# Patient Record
Sex: Female | Born: 1975 | Race: White | Hispanic: No | Smoking: Never smoker
Health system: Southern US, Community
[De-identification: ages and names within clinical notes are randomized; demographics above are authoritative.]

## PROBLEM LIST (undated history)

## (undated) DIAGNOSIS — N800 Endometriosis of uterus: Secondary | ICD-10-CM

## (undated) DIAGNOSIS — K219 Gastro-esophageal reflux disease without esophagitis: Secondary | ICD-10-CM

## (undated) HISTORY — DX: Endometriosis of uterus: N80.0

## (undated) HISTORY — DX: Gastro-esophageal reflux disease without esophagitis: K21.9

---

## 2004-03-10 ENCOUNTER — Emergency Department: Payer: Self-pay | Admitting: Emergency Medicine

## 2008-04-04 DIAGNOSIS — N8000 Endometriosis of the uterus, unspecified: Secondary | ICD-10-CM

## 2008-04-04 HISTORY — PX: DILATION AND CURETTAGE OF UTERUS: SHX78

## 2008-04-04 HISTORY — DX: Endometriosis of the uterus, unspecified: N80.00

## 2008-04-04 HISTORY — DX: Endometriosis of uterus: N80.0

## 2009-01-02 HISTORY — PX: OTHER SURGICAL HISTORY: SHX169

## 2011-01-11 ENCOUNTER — Ambulatory Visit: Payer: Self-pay | Admitting: Internal Medicine

## 2013-04-11 ENCOUNTER — Ambulatory Visit: Payer: Self-pay | Admitting: Obstetrics & Gynecology

## 2013-04-18 ENCOUNTER — Ambulatory Visit: Payer: BC Managed Care – PPO

## 2013-04-18 ENCOUNTER — Ambulatory Visit (INDEPENDENT_AMBULATORY_CARE_PROVIDER_SITE_OTHER): Payer: BC Managed Care – PPO | Admitting: General Surgery

## 2013-04-18 ENCOUNTER — Encounter: Payer: Self-pay | Admitting: General Surgery

## 2013-04-18 VITALS — BP 122/78 | HR 76 | Resp 12 | Ht 64.0 in | Wt 209.0 lb

## 2013-04-18 DIAGNOSIS — N63 Unspecified lump in unspecified breast: Secondary | ICD-10-CM

## 2013-04-18 HISTORY — PX: BREAST SURGERY: SHX581

## 2013-04-18 NOTE — Progress Notes (Signed)
Patient ID: Robyn Espinoza, female   DOB: [birth year], 38 y.o.   MRN: [id number]  Chief Complaint  Patient presents with  . Other    mammogram    HPI Robyn Espinoza is a 38 y.o. female who presents for a breast evaluation. The most recent mammogram was done on 03/25/13 . Also added views and ultrasound on left breast on 04/11/13.  Patient does perform regular self breast checks .She  states on 03/25/13 was her first mammogram. The patient is not aware of any trauma to the breast. She has one child, a 22 year old son. She did not breast-feed. No paternal family history of breast cancer. The patient reports little knowledge of her father's side of the family. She is accompanied today by her younger sister.  HPI  Past Medical History  Diagnosis Date  . Endometriosis, cervix   . GERD (gastroesophageal reflux disease)     Past Surgical History  Procedure Laterality Date  . Dilation and curettage of uterus  2010  . Laproscopy  01/2009  . Breast surgery Left April 18, 2048    vacuum biopsy left upper outer quadrant lesion    No family history on file.  Social History History  Substance Use Topics  . Smoking status: Never Smoker   . Smokeless tobacco: Never Used  . Alcohol Use: No    No Known Allergies  Current Outpatient Prescriptions  Medication Sig Dispense Refill  . esomeprazole (NEXIUM) 40 MG capsule Take 40 mg by mouth daily at 12 noon.      . etonogestrel (NEXPLANON) 68 MG IMPL implant Inject 1 each into the skin once.       No current facility-administered medications for this visit.    Review of Systems Review of Systems  Constitutional: Negative.   Respiratory: Negative.   Cardiovascular: Negative.     Blood pressure 122/78, pulse 76, resp. rate 12, height 5\' 4"  (1.626 m), weight 209 lb (94.802 kg), last menstrual period 03/19/2011.  Physical Exam Physical Exam  Constitutional: She is oriented to person, place, and time. She appears well-developed and  well-nourished.  Eyes: Conjunctivae are normal. No scleral icterus.  Neck: Neck supple.  Cardiovascular: Normal rate, regular rhythm and normal heart sounds.   Pulmonary/Chest: Effort normal and breath sounds normal. Right breast exhibits no inverted nipple, no mass, no nipple discharge, no skin change and no tenderness.  Lymphadenopathy:    She has no cervical adenopathy.    She has no axillary adenopathy.  Neurological: She is alert and oriented to person, place, and time.  Skin: Skin is warm and dry.    Data Reviewed Screening mammograms dated March 25, 2013 showed dense breast tissue with a suggestion of a nodular density in the upper-outer quadrant measuring 8-9 mm. Additional views were requested.  Focal spot compression views and ultrasound dated April 11, 2012 showed a persistent lobular mass measuring 1.7 cm. Ultrasound suggested an irregular hypoechoic mass at 2:00, 2 cm from the nipple measuring 0.9 x 1.0 x 1.2 cm. BI-RAD-4.  PCP notes reviewed.  Ultrasound examination of the left breast in the upper outer quadrant at the one to 2:00 position 2 cm from nipple indeed showed a multilobulated hypoechoic mass with a decreased tic shadowing measuring up to 1.5 cm in diameter.  The patient was amenable to vacuum biopsy. A total of 10 cc of 0.5% Xylocaine with 0.25% Marcaine with one 200,000 units of epinephrine was utilized well-tolerated. Chlor prep was applied to the skin. Approximately 12 core  samples, 9-gauge making use of an Encor vacuum biopsy device was completed with near complete removal of the lesion. A postbiopsy clip was placed. Skin defect was closed with benzoin and Steri-Strips followed by Telfa and Tegaderm dressing. The procedure was well-tolerated.  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     Plan    Postbiopsy instructions were provided. Arrangements are in place for a nursing follow up in one week. The patient will be contacted with the  pathology results are returned.        Robyn MayotteByrnett, Jeffrey W 04/18/2013, 9:57 PM

## 2013-04-18 NOTE — Patient Instructions (Signed)

## 2013-04-19 ENCOUNTER — Telehealth: Payer: Self-pay | Admitting: General Surgery

## 2013-04-19 LAB — PATHOLOGY

## 2013-04-19 NOTE — Telephone Encounter (Signed)
Patient notified that pathology showed a fibroadenoma, as expected. Reports she is doing well. She will return next week for staff wound check. She will be asked to return for MD check in six months.

## 2013-04-25 ENCOUNTER — Ambulatory Visit (INDEPENDENT_AMBULATORY_CARE_PROVIDER_SITE_OTHER): Payer: BC Managed Care – PPO | Admitting: *Deleted

## 2013-04-25 DIAGNOSIS — N63 Unspecified lump in unspecified breast: Secondary | ICD-10-CM

## 2013-04-25 NOTE — Patient Instructions (Signed)
The patient is aware that a heating pad may be used for comfort as needed.   Continue self breast exams. Call office for any new breast issues or concerns. 

## 2013-04-25 NOTE — Progress Notes (Signed)
Patient here today for follow up post left breast biopsy.  Steristrip in place and aware it may come off in one week.  Minimal bruising noted.  The patient is aware that a heating pad may be used for comfort as needed.  Aware of pathology. Follow up as needed.

## 2013-07-22 ENCOUNTER — Ambulatory Visit: Payer: Self-pay | Admitting: Internal Medicine

## 2013-10-22 ENCOUNTER — Ambulatory Visit: Payer: Self-pay | Admitting: General Surgery

## 2013-11-26 ENCOUNTER — Encounter: Payer: Self-pay | Admitting: *Deleted

## 2014-02-03 ENCOUNTER — Encounter: Payer: Self-pay | Admitting: General Surgery

## 2015-12-08 ENCOUNTER — Encounter: Payer: Self-pay | Admitting: *Deleted

## 2015-12-14 ENCOUNTER — Ambulatory Visit (INDEPENDENT_AMBULATORY_CARE_PROVIDER_SITE_OTHER): Payer: BLUE CROSS/BLUE SHIELD | Admitting: General Surgery

## 2015-12-14 ENCOUNTER — Encounter: Payer: Self-pay | Admitting: General Surgery

## 2015-12-14 VITALS — BP 124/80 | HR 78 | Resp 12 | Ht 64.0 in | Wt 221.0 lb

## 2015-12-14 DIAGNOSIS — G8929 Other chronic pain: Secondary | ICD-10-CM | POA: Diagnosis not present

## 2015-12-14 DIAGNOSIS — R1010 Upper abdominal pain, unspecified: Secondary | ICD-10-CM

## 2015-12-14 DIAGNOSIS — K219 Gastro-esophageal reflux disease without esophagitis: Secondary | ICD-10-CM | POA: Diagnosis not present

## 2015-12-14 DIAGNOSIS — R1011 Right upper quadrant pain: Principal | ICD-10-CM

## 2015-12-14 NOTE — Patient Instructions (Addendum)
Patient to have a HIDA scan done   [facility]. She will pre admit by phone. The patient is aware of date and instructions.

## 2015-12-14 NOTE — Progress Notes (Signed)
Patient ID: Robyn Espinoza, female   DOB: 02-Oct-1975, 40 y.o.   MRN: [id number]  Chief Complaint  Patient presents with  . Abdominal Pain    HPI Robyn Espinoza is a 40 y.o. female here today for a evaluation of abdominal pain for about three weeks . Patient states the pain is located in right upper quadrant and radiates to her epigastric area.  The pain is described as sharp and the pain last for three hours . After she eats the pain starts. Moves her bowel every three days, This is her baseline.  No history of diarrhea.  Long history of reflux, recently not is well controlled with Nexium and Zantac.  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 She was initially seen at a clinic in [place] and at that time was noted to have significant right upper quadrant pain. She was transferred to the [facility] emergency department in [place] where she underwent laboratory studies showing an elevated white blood cell count of 12,000 with a left shift, normal urinalysis except for a specific gravity greater than 1.030, elevated blood sugar 161, normal electrolytes and liver function studies. Ultrasound failed to show evidence of gallstones, changes consistent with fatty liver. Common bile duct of 3 mm. Gallbladder was distended. No gallbladder wall thickening, 2 mm. Negative sonographic Murphy sign. No pericholecystic fluid.  The patient separately underwent a contrast-enhanced CT that showed no acute intra-abdominal abnormality. It did confirm the ultrasound impression of static steatosis.  The patient [place] by [place] employer and she returned home for further assessment.  The patient reports she has had chills during her episodes of pain but has not taken her temperature. Vomiting has not been a part of her present symptom complex.  Marland KitchenHPI  Past Medical History:  Diagnosis Date  . Endometriosis, cervix 2010   [place]  . GERD  (gastroesophageal reflux disease)     Past Surgical History:  Procedure Laterality Date  . BREAST SURGERY Left April 18, 2048   vacuum biopsy left upper outer quadrant lesion  . DILATION AND CURETTAGE OF UTERUS  2010  . laproscopy  01/2009    History reviewed. No pertinent family history.  Social History Social History  Substance Use Topics  . Smoking status: Never Smoker  . Smokeless tobacco: Never Used  . Alcohol use No    No Known Allergies  Current Outpatient Prescriptions  Medication Sig Dispense Refill  . dicyclomine (BENTYL) 20 MG tablet Take 20 mg by mouth every 6 (six) hours.    Marland Kitchen esomeprazole (NEXIUM) 40 MG capsule Take 40 mg by mouth daily at 12 noon.    . etonogestrel (NEXPLANON) 68 MG IMPL implant Inject 1 each into the skin once.    Marland Kitchen HYDROcodone-acetaminophen (NORCO/VICODIN) 5-325 MG tablet Take 1 tablet by mouth every 6 (six) hours as needed for moderate pain.    Marland Kitchen ibuprofen (ADVIL,MOTRIN) 800 MG tablet Take 800 mg by mouth every 8 (eight) hours as needed.    . ondansetron (ZOFRAN) 4 MG tablet Take 4 mg by mouth every 8 (eight) hours as needed for nausea or vomiting.    . ranitidine (ZANTAC) 150 MG tablet Take 150 mg by mouth 2 (two) times daily.    . valACYclovir (VALTREX) 500 MG tablet TAKE 1 TABLET ORALLY DAILY  11   No current facility-administered medications for this visit.     Review of Systems Review of Systems  Constitutional: Positive for chills (  aleast once a day).  Respiratory: Negative.   Gastrointestinal: Positive for abdominal pain and nausea.    Blood pressure 124/80, pulse 78, resp. rate 12, height 5\' 4"  (1.626 m), weight 221 lb (100.2 kg).  Physical Exam Physical Exam  Constitutional: She is oriented to person, place, and time. She appears well-developed and well-nourished.  Eyes: Conjunctivae are normal. No scleral icterus.  Neck: Neck supple.  Cardiovascular: Normal rate, regular rhythm and normal heart sounds.    Pulmonary/Chest: Effort normal and breath sounds normal.  Abdominal: Soft. Normal appearance and bowel sounds are normal. There is no hepatomegaly. There is tenderness in the right lower quadrant. No hernia.    Lymphadenopathy:    She has no cervical adenopathy.  Neurological: She is alert and oriented to person, place, and time.  Skin: Skin is warm and dry.    Data Reviewed Tory studies dated December 02, 2015 showed white blood cell, 12,600 with 82% polys and 16% lymphs, hemoglobin 14.2 with an MCV of 87. Platelet count of 352,000. Urinalysis notable for specific gravity 1.030. Negative nitrite, negative leukocyte Estrace. Competence metabolic panel showed a creatinine of 0.8 with a BUN of 16. Normal electrolytes area random blood sugar 161 (4 PM in the afternoon) sees. Estimated GFR: 96. Normal liver function studies. Normal lipase and alkaline phosphatase. Total bilirubin 0.8, direct bili is 0.6 (0.1-0.4) sees indirect bilirubin 0.2.  Assessment    Clinical history compatible with biliary colic, no stones identified on ultrasound. No other pathology identified on CT.  Long history of gastroesophageal reflux, present symptoms markedly different from her baseline.    Plan    We'll arrange for a HIDA with CCK to assess gallbladder function. If her gallbladder is either nonfunctional, or her symptoms are replicated with the administration of a fatty meal/CCK, we'll proceed to cholecystectomy. If the scan is normal, we'll need to reassess other possibilities for her pain.  She is anxious to return to work as she is the sole breadwinner for the family, and she needs to be 100% before she returns to [place].  Laparoscopic Cholecystectomy with Intraoperative Cholangiogram. The procedure, including it's potential risks and complications (including but not limited to infection, bleeding, injury to intra-abdominal organs or bile ducts, bile leak, poor cosmetic result, sepsis and death) were  discussed with the patient in detail. Non-operative options, including their inherent risks (acute calculous cholecystitis with possible choledocholithiasis or gallstone pancreatitis, with the risk of ascending cholangitis, sepsis, and death) were discussed as well. The patient expressed and understanding of what we discussed and wishes to proceed with laparoscopic cholecystectomy. The patient further understands that if it is technically not possible, or it is unsafe to proceed laparoscopically, that I will convert to an open cholecystectomy.      [facility]. She will pre admit by phone. The patient is aware of date and instructions.  Patient to have a HIDA scan done  This information has been scribed by Ples SpecterJessica Qualls CMA.   Earline MayotteByrnett, Margreat Widener W 12/15/2015, 10:21 AM    Repeat CBC completed on 12/14/2015 shows a persistent leukocytosis of 12,600 with less of a left shift, 67% polys, 26% lymphocytes. Hemoglobin 13.5.

## 2015-12-15 ENCOUNTER — Telehealth: Payer: Self-pay

## 2015-12-15 ENCOUNTER — Other Ambulatory Visit: Payer: Self-pay

## 2015-12-15 ENCOUNTER — Encounter: Payer: Self-pay | Admitting: General Surgery

## 2015-12-15 ENCOUNTER — Other Ambulatory Visit
Admission: RE | Admit: 2015-12-15 | Discharge: 2015-12-15 | Disposition: A | Discharge status: Home / Self Care | Payer: BLUE CROSS/BLUE SHIELD | Source: Ambulatory Visit | Attending: General Surgery | Admitting: General Surgery

## 2015-12-15 DIAGNOSIS — R1011 Right upper quadrant pain: Principal | ICD-10-CM

## 2015-12-15 DIAGNOSIS — Z01812 Encounter for preprocedural laboratory examination: Secondary | ICD-10-CM | POA: Diagnosis present

## 2015-12-15 DIAGNOSIS — Z01818 Encounter for other preprocedural examination: Secondary | ICD-10-CM

## 2015-12-15 DIAGNOSIS — K219 Gastro-esophageal reflux disease without esophagitis: Secondary | ICD-10-CM | POA: Insufficient documentation

## 2015-12-15 DIAGNOSIS — G8929 Other chronic pain: Secondary | ICD-10-CM | POA: Insufficient documentation

## 2015-12-15 LAB — CBC WITH DIFFERENTIAL/PLATELET
Basophils Absolute: 0 10*3/uL (ref 0.0–0.2)
Basos: 0 %
EOS (ABSOLUTE): 0.2 10*3/uL (ref 0.0–0.4)
EOS: 1 %
HEMATOCRIT: 39.3 % (ref 34.0–46.6)
HEMOGLOBIN: 13.5 g/dL (ref 11.1–15.9)
IMMATURE GRANS (ABS): 0 10*3/uL (ref 0.0–0.1)
IMMATURE GRANULOCYTES: 0 %
Lymphocytes Absolute: 3.3 10*3/uL — ABNORMAL HIGH (ref 0.7–3.1)
Lymphs: 26 %
MCH: 30.2 pg (ref 26.6–33.0)
MCHC: 34.4 g/dL (ref 31.5–35.7)
MCV: 88 fL (ref 79–97)
MONOCYTES: 6 %
Monocytes Absolute: 0.8 10*3/uL (ref 0.1–0.9)
NEUTROS PCT: 67 %
Neutrophils Absolute: 8.3 10*3/uL — ABNORMAL HIGH (ref 1.4–7.0)
Platelets: 358 10*3/uL (ref 150–379)
RBC: 4.47 x10E6/uL (ref 3.77–5.28)
RDW: 13.1 % (ref 12.3–15.4)
WBC: 12.6 10*3/uL — AB (ref 3.4–10.8)

## 2015-12-15 LAB — PREGNANCY, URINE: Preg Test, Ur: NEGATIVE

## 2015-12-15 NOTE — Telephone Encounter (Signed)
Spoke with the patient and she is scheduled for [facility] at 8:00 am. She will be off of her Vicodin for 24 hours prior. She will arrive at 7:30 am [facility] and have nothing to eat or drink after midnight the night before. The patient is aware of date, time, and instructions.

## 2015-12-16 ENCOUNTER — Encounter
Admission: RE | Admit: 2015-12-16 | Discharge: 2015-12-16 | Disposition: A | Discharge status: Home / Self Care | Payer: BLUE CROSS/BLUE SHIELD | Source: Ambulatory Visit | Attending: General Surgery | Admitting: General Surgery

## 2015-12-16 ENCOUNTER — Encounter: Payer: Self-pay | Admitting: Radiology

## 2015-12-16 DIAGNOSIS — G8929 Other chronic pain: Secondary | ICD-10-CM | POA: Diagnosis present

## 2015-12-16 DIAGNOSIS — R1010 Upper abdominal pain, unspecified: Secondary | ICD-10-CM | POA: Diagnosis present

## 2015-12-16 DIAGNOSIS — R1011 Right upper quadrant pain: Secondary | ICD-10-CM

## 2015-12-16 MED ORDER — TECHNETIUM TC 99M MEBROFENIN IV KIT
5.0000 | PACK | Freq: Once | INTRAVENOUS | Status: AC | PRN
  Administered 2015-12-16: 5.07 via INTRAVENOUS

## 2015-12-17 ENCOUNTER — Other Ambulatory Visit: Payer: Self-pay | Admitting: General Surgery

## 2015-12-17 ENCOUNTER — Telehealth: Payer: Self-pay

## 2015-12-17 ENCOUNTER — Other Ambulatory Visit: Payer: Self-pay

## 2015-12-17 DIAGNOSIS — R1013 Epigastric pain: Secondary | ICD-10-CM

## 2015-12-17 NOTE — H&P (Signed)
Robyn BlueSuzanne D Espinoza [id number] 04-Aug-1975     HPI: Patient seen with epigastric and right upper quadrant pain. CT and ultrasound negative in [place]. Recently completed HIDA with CCK showed normal gallbladder ejection fraction of 60% without reproduction of symptoms.  Long history of reflux, now poorly controlled with Nexium. For upper endoscopy.   (Not in a hospital admission) No Known Allergies Past Medical History:  Diagnosis Date  . Endometriosis, cervix 2010   [place]  . GERD (gastroesophageal reflux disease)    Past Surgical History:  Procedure Laterality Date  . BREAST SURGERY Left 04/18/2013   vacuum biopsy left upper outer quadrant lesion, fibroadenoma  . DILATION AND CURETTAGE OF UTERUS  2010  . laproscopy  01/2009   Social History   Social History  . Marital status: Divorced    Spouse name: N/A  . Number of children: N/A  . Years of education: N/A   Occupational History  . Not on file.   Social History Main Topics  . Smoking status: Never Smoker  . Smokeless tobacco: Never Used  . Alcohol use No  . Drug use: No  . Sexual activity: Not on file   Other Topics Concern  . Not on file   Social History Narrative  . No narrative on file   Social History   Social History Narrative  . No narrative on file     ROS: Negative.     PE: HEENT: Negative. Lungs: Clear. Cardio: RR. Robyn Espinoza, Robyn Espinoza 12/17/2015   Assessment/Plan:  Proceed with planned endoscopy.

## 2015-12-17 NOTE — Telephone Encounter (Signed)
The patent's surgery for 12/18/15 has been canceled. She is to have an upper endoscopy done on 12/18/15. Instructions, and date all discussed with the patient. She is fully aware.

## 2015-12-18 ENCOUNTER — Ambulatory Visit
Admission: RE | Admit: 2015-12-18 | Payer: BLUE CROSS/BLUE SHIELD | Source: Ambulatory Visit | Admitting: General Surgery

## 2015-12-18 ENCOUNTER — Ambulatory Visit
Admission: RE | Admit: 2015-12-18 | Discharge: 2015-12-18 | Disposition: A | Discharge status: Home / Self Care | Payer: BLUE CROSS/BLUE SHIELD | Source: Ambulatory Visit | Attending: General Surgery | Admitting: General Surgery

## 2015-12-18 ENCOUNTER — Ambulatory Visit: Payer: BLUE CROSS/BLUE SHIELD | Admitting: Anesthesiology

## 2015-12-18 ENCOUNTER — Encounter: Payer: Self-pay | Admitting: Anesthesiology

## 2015-12-18 ENCOUNTER — Encounter
Admission: RE | Disposition: A | Discharge status: Home / Self Care | Payer: Self-pay | Source: Ambulatory Visit | Attending: General Surgery

## 2015-12-18 ENCOUNTER — Encounter: Admission: RE | Payer: Self-pay | Source: Ambulatory Visit

## 2015-12-18 DIAGNOSIS — K449 Diaphragmatic hernia without obstruction or gangrene: Secondary | ICD-10-CM | POA: Diagnosis not present

## 2015-12-18 DIAGNOSIS — Z79899 Other long term (current) drug therapy: Secondary | ICD-10-CM | POA: Diagnosis not present

## 2015-12-18 DIAGNOSIS — K219 Gastro-esophageal reflux disease without esophagitis: Secondary | ICD-10-CM | POA: Diagnosis not present

## 2015-12-18 DIAGNOSIS — K2950 Unspecified chronic gastritis without bleeding: Secondary | ICD-10-CM | POA: Diagnosis not present

## 2015-12-18 DIAGNOSIS — R1011 Right upper quadrant pain: Secondary | ICD-10-CM

## 2015-12-18 DIAGNOSIS — R1013 Epigastric pain: Secondary | ICD-10-CM

## 2015-12-18 DIAGNOSIS — G8929 Other chronic pain: Secondary | ICD-10-CM

## 2015-12-18 HISTORY — PX: ESOPHAGOGASTRODUODENOSCOPY (EGD) WITH PROPOFOL: SHX5813

## 2015-12-18 LAB — HEPATIC FUNCTION PANEL
ALK PHOS: 72 U/L (ref 38–126)
ALT: 38 U/L (ref 14–54)
AST: 28 U/L (ref 15–41)
Albumin: 3.7 g/dL (ref 3.5–5.0)
BILIRUBIN DIRECT: 0.1 mg/dL (ref 0.1–0.5)
BILIRUBIN INDIRECT: 0.4 mg/dL (ref 0.3–0.9)
Total Bilirubin: 0.5 mg/dL (ref 0.3–1.2)
Total Protein: 6.8 g/dL (ref 6.5–8.1)

## 2015-12-18 LAB — POCT PREGNANCY, URINE: PREG TEST UR: NEGATIVE

## 2015-12-18 SURGERY — LAPAROSCOPIC CHOLECYSTECTOMY WITH INTRAOPERATIVE CHOLANGIOGRAM
Anesthesia: Choice

## 2015-12-18 SURGERY — ESOPHAGOGASTRODUODENOSCOPY (EGD) WITH PROPOFOL
Anesthesia: General

## 2015-12-18 MED ORDER — PANTOPRAZOLE SODIUM 40 MG PO TBEC: 40.0000 mg | DELAYED_RELEASE_TABLET | Freq: Every day | ORAL | 11 refills | Status: DC

## 2015-12-18 MED ORDER — PROPOFOL 500 MG/50ML IV EMUL
INTRAVENOUS | Status: DC | PRN
  Administered 2015-12-18: 190 ug/kg/min via INTRAVENOUS

## 2015-12-18 MED ORDER — PROPOFOL 10 MG/ML IV BOLUS
INTRAVENOUS | Status: DC | PRN
  Administered 2015-12-18 (×2): 100 mg via INTRAVENOUS

## 2015-12-18 MED ORDER — SODIUM CHLORIDE 0.9 % IV SOLN
INTRAVENOUS | Status: DC
  Administered 2015-12-18: 1000 mL via INTRAVENOUS

## 2015-12-18 NOTE — Transfer of Care (Signed)
Immediate Anesthesia Transfer of Care Note  Patient: [name]  Procedure(s) Performed: Procedure(s): ESOPHAGOGASTRODUODENOSCOPY (EGD) WITH PROPOFOL (N/A)  Patient Location: PACU  Anesthesia Type:General  Level of Consciousness: sedated  Airway & Oxygen Therapy: Patient Spontanous Breathing and Patient connected to face mask oxygen  Post-op Assessment: Report given to RN and Post -op Vital signs reviewed and stable  Post vital signs: Reviewed and stable  Last Vitals:  Vitals:   12/18/15 1220 12/18/15 1318  BP: 132/67 133/73  Pulse: 60 72  Resp: 16 13  Temp: 36.7 C (!) 36.1 C    Complications: No apparent anesthesia complications

## 2015-12-18 NOTE — H&P (Signed)
No change in clinical history or exam.  HIDA w/ stimulation did not reproduce symptoms.  For EGD and biopsy.

## 2015-12-18 NOTE — Anesthesia Preprocedure Evaluation (Signed)
Anesthesia Evaluation  Patient identified by MRN, date of birth, ID band Patient awake    Reviewed: Allergy & Precautions, H&P , NPO status , Patient's Chart, lab work & pertinent test results, reviewed documented beta blocker date and time   History of Anesthesia Complications Negative for: history of anesthetic complications  Airway Mallampati: III  TM Distance: >3 FB Neck ROM: full    Dental no notable dental hx. (+) Missing, Teeth Intact   Pulmonary neg pulmonary ROS,    Pulmonary exam normal breath sounds clear to auscultation       Cardiovascular Exercise Tolerance: Good negative cardio ROS Normal cardiovascular exam Rhythm:regular Rate:Normal     Neuro/Psych negative neurological ROS  negative psych ROS   GI/Hepatic Neg liver ROS, GERD  ,  Endo/Other  negative endocrine ROS  Renal/GU negative Renal ROS  negative genitourinary   Musculoskeletal   Abdominal   Peds  Hematology negative hematology ROS (+)   Anesthesia Other Findings Past Medical History: 2010: Endometriosis, cervix     Comment: [place] No date: GERD (gastroesophageal reflux disease)   Reproductive/Obstetrics negative OB ROS                             Anesthesia Physical Anesthesia Plan  ASA: II  Anesthesia Plan: General   Post-op Pain Management:    Induction:   Airway Management Planned:   Additional Equipment:   Intra-op Plan:   Post-operative Plan:   Informed Consent: I have reviewed the patients History and Physical, chart, labs and discussed the procedure including the risks, benefits and alternatives for the proposed anesthesia with the patient or authorized representative who has indicated his/her understanding and acceptance.   Dental Advisory Given  Plan Discussed with: Anesthesiologist, CRNA and Surgeon  Anesthesia Plan Comments:         Anesthesia Quick Evaluation

## 2015-12-18 NOTE — Anesthesia Procedure Notes (Signed)
Date/Time: 12/18/2015 1:00 PM Performed by: Stormy FabianURTIS, Augusta Hilbert Pre-anesthesia Checklist: Patient identified, Emergency Drugs available, Suction available and Patient being monitored Patient Re-evaluated:Patient Re-evaluated prior to inductionOxygen Delivery Method: Nasal cannula Intubation Type: IV induction Dental Injury: Teeth and Oropharynx as per pre-operative assessment  Comments: Nasal cannula with etCO2 monitoring

## 2015-12-18 NOTE — Op Note (Signed)
[facility] Gastroenterology Patient Name: Robyn Espinoza Procedure Date: 12/18/2015 12:01 PM MRN: [id number] Account #: [id number] Date of Birth: [birth year] Admit Type: Outpatient Age: 40 Room: [facility] ENDO ROOM 2 Gender: Female Note Status: Finalized Procedure:            Upper GI endoscopy Indications:          Epigastric abdominal pain, Suspected gastro-esophageal                        reflux disease Providers:            Earline Mayotte, MD Referring MD:         Margaretann Loveless, MD (Referring MD) Medicines:            Monitored Anesthesia Care Complications:        No immediate complications. Procedure:            Pre-Anesthesia Assessment:                       - Prior to the procedure, a History and Physical was                        performed, and patient medications, allergies and                        sensitivities were reviewed. The patient's tolerance of                        previous anesthesia was reviewed.                       - The risks and benefits of the procedure and the                        sedation options and risks were discussed with the                        patient. All questions were answered and informed                        consent was obtained.                       After obtaining informed consent, the endoscope was                        passed under direct vision. Throughout the procedure,                        the patient's blood pressure, pulse, and oxygen                        saturations were monitored continuously. The Endoscope                        was introduced through the mouth, and advanced to the                        second part of duodenum. The upper GI endoscopy was  accomplished without difficulty. The patient tolerated                        the procedure well. Findings:      A small hiatal hernia was present. GEJ at 37 cm.. Biopsis at 35 cm.      The stomach was normal. Cold  biopsis of prepyloric area obtained.      The examined duodenum was normal. Impression:           - Small hiatal hernia.                       - Normal stomach.                       - Normal examined duodenum.                       - No specimens collected. Recommendation:       - Discharge patient to home.                       - Telephone endoscopist for pathology results in 4 days. Procedure Code(s):    --- Professional ---                       [phone], Esophagogastroduodenoscopy, flexible, transoral;                        diagnostic, including collection of specimen(s) by                        brushing or washing, when performed (separate procedure) Diagnosis Code(s):    --- Professional ---                       R10.13, Epigastric pain                       K44.9, Diaphragmatic hernia without obstruction or                        gangrene CPT copyright 2016 American Medical Association. All rights reserved. The codes documented in this report are preliminary and upon coder review may  be revised to meet current compliance requirements. Earline MayotteJeffrey W. Sem Mccaughey, MD 12/18/2015 1:15:34 PM This report has been signed electronically. Number of Addenda: 0 Note Initiated On: 12/18/2015 12:01 PM      [facility]

## 2015-12-21 ENCOUNTER — Encounter: Payer: Self-pay | Admitting: General Surgery

## 2015-12-21 ENCOUNTER — Telehealth: Payer: Self-pay | Admitting: General Surgery

## 2015-12-21 LAB — SURGICAL PATHOLOGY

## 2015-12-21 NOTE — Anesthesia Postprocedure Evaluation (Signed)
Anesthesia Post Note  Patient: Robyn Espinoza  Procedure(s) Performed: Procedure(s) (LRB): ESOPHAGOGASTRODUODENOSCOPY (EGD) WITH PROPOFOL (N/A)  Patient location during evaluation: Endoscopy Anesthesia Type: General Level of consciousness: awake and alert Pain management: pain level controlled Vital Signs Assessment: post-procedure vital signs reviewed and stable Respiratory status: spontaneous breathing, nonlabored ventilation, respiratory function stable and patient connected to nasal cannula oxygen Cardiovascular status: blood pressure returned to baseline and stable Postop Assessment: no signs of nausea or vomiting Anesthetic complications: no    Last Vitals:  Vitals:   12/18/15 1328 12/18/15 1358  BP: (!) 123/104 136/82  Pulse: 64 (!) 59  Resp: 19 16  Temp:      Last Pain:  Vitals:   12/18/15 1318  TempSrc: Tympanic                 Lenard SimmerAndrew Carel Schnee

## 2015-12-21 NOTE — Telephone Encounter (Signed)
12-21-15 @ 10:30AM PT CALLED IN STATING SHE HAD AN UPPER ENDOSCOPY DONE ON Friday(12-18-15)&DR BYRNETT TOLD HER TO CALL IN THIS AM,BUT NOT SURE WHY. HE DID SAY SOMETHING ABOUT A STOOL SAMPLE,BUT THE ENDO NURSE SAID THEY DON'T DO IT THERE. THERE WHERE NO TELEPHONE MESSAGES IN SYSTEM & [name] CHECKED ALL THE BOXES AND NO NOTES WHERE IN.

## 2015-12-22 ENCOUNTER — Telehealth: Payer: Self-pay | Admitting: *Deleted

## 2015-12-22 NOTE — Telephone Encounter (Signed)
I talked with the patient and she will [facility] and [facility] specimen collection container, she was never given that instruction the day of her endoscopy. She states her symptoms seem to be the same.

## 2015-12-22 NOTE — Telephone Encounter (Signed)
-----   Message from Earline MayotteJeffrey W Byrnett, MD sent at 12/21/2015  9:45 PM EDT -----  Please notify path results fine.   See if patient has submitted stool sample for h pylori testing.   Any change in abdominal symptoms w/ change from Nexium to Protonix?  ----- Message ----- From: Interface, [facility] [place] Sent: 12/18/2015   2:17 PM To: Earline MayotteJeffrey W Byrnett, MD

## 2015-12-30 ENCOUNTER — Other Ambulatory Visit
Admission: RE | Admit: 2015-12-30 | Discharge: 2015-12-30 | Disposition: A | Discharge status: Home / Self Care | Payer: BLUE CROSS/BLUE SHIELD | Source: Ambulatory Visit | Attending: General Surgery | Admitting: General Surgery

## 2015-12-30 DIAGNOSIS — R1010 Upper abdominal pain, unspecified: Secondary | ICD-10-CM | POA: Diagnosis not present

## 2015-12-30 DIAGNOSIS — G8929 Other chronic pain: Secondary | ICD-10-CM | POA: Diagnosis present

## 2016-01-01 LAB — H. PYLORI ANTIGEN, STOOL: H. PYLORI STOOL AG, EIA: NEGATIVE

## 2016-01-04 ENCOUNTER — Telehealth: Payer: Self-pay | Admitting: *Deleted

## 2016-01-04 NOTE — Telephone Encounter (Signed)
-----   Message from Earline MayotteJeffrey W Byrnett, MD sent at 01/01/2016  9:47 AM EDT ----- Please notify the patient of the stool sample was negative. It a progress report on her abdominal pain. Thank you ----- Message ----- From: Leory PlowmanInterface, [facility] [place] Sent: 01/01/2016   9:40 AM To: Earline MayotteJeffrey W Byrnett, MD

## 2016-01-04 NOTE — Telephone Encounter (Signed)
Patient states the she is having acid reflux and that the Protonix is not helping, Dr. Lemar LivingsByrnett states she can start using the Nexium again . Patient states she is not having no more abdominal pain . May return to work on 01/10/16.

## 2016-01-04 NOTE — Telephone Encounter (Signed)
Notified patient as instructed, patient pleased. Discussed follow-up appointments, patient agrees  

## 2016-05-19 ENCOUNTER — Other Ambulatory Visit: Payer: Self-pay | Admitting: Nurse Practitioner

## 2016-05-19 DIAGNOSIS — Z1231 Encounter for screening mammogram for malignant neoplasm of breast: Secondary | ICD-10-CM

## 2016-07-28 ENCOUNTER — Ambulatory Visit: Payer: BLUE CROSS/BLUE SHIELD | Attending: Nurse Practitioner

## 2016-07-29 ENCOUNTER — Ambulatory Visit (INDEPENDENT_AMBULATORY_CARE_PROVIDER_SITE_OTHER): Payer: Managed Care, Other (non HMO)

## 2016-07-29 ENCOUNTER — Ambulatory Visit (INDEPENDENT_AMBULATORY_CARE_PROVIDER_SITE_OTHER): Payer: Managed Care, Other (non HMO) | Admitting: Podiatry

## 2016-07-29 ENCOUNTER — Encounter: Payer: Self-pay | Admitting: Podiatry

## 2016-07-29 DIAGNOSIS — M79672 Pain in left foot: Secondary | ICD-10-CM

## 2016-07-29 DIAGNOSIS — M79671 Pain in right foot: Secondary | ICD-10-CM

## 2016-07-29 DIAGNOSIS — M779 Enthesopathy, unspecified: Secondary | ICD-10-CM

## 2016-07-29 DIAGNOSIS — M778 Other enthesopathies, not elsewhere classified: Secondary | ICD-10-CM

## 2016-07-29 DIAGNOSIS — M79673 Pain in unspecified foot: Secondary | ICD-10-CM

## 2016-07-29 DIAGNOSIS — M7671 Peroneal tendinitis, right leg: Secondary | ICD-10-CM | POA: Diagnosis not present

## 2016-07-29 DIAGNOSIS — M722 Plantar fascial fibromatosis: Secondary | ICD-10-CM

## 2016-07-29 MED ORDER — MELOXICAM 15 MG PO TABS: 15.0000 mg | ORAL_TABLET | Freq: Every day | ORAL | 3 refills | Status: DC

## 2016-07-29 MED ORDER — METHYLPREDNISOLONE 4 MG PO TABS: ORAL_TABLET | ORAL | 0 refills | Status: DC

## 2016-07-30 NOTE — Progress Notes (Signed)
   Subjective: Patient presents today for aching, burning pain and tenderness in the feet bilaterally, right worse than left, that has been present for approximately 1 year. She reports an associated burning sensation to the lateral side of the right foot. Patient states that it hurts in the mornings with the first steps out of bed. Patient presents today for further treatment and evaluation  Objective: Physical Exam General: The patient is alert and oriented x3 in no acute distress.  Dermatology: Skin is warm, dry and supple bilateral lower extremities. Negative for open lesions or macerations bilateral.   Vascular: Dorsalis Pedis and Posterior Tibial pulses palpable bilateral.  Capillary fill time is immediate to all digits.  Neurological: Epicritic and protective threshold intact bilateral.   Musculoskeletal: Tenderness to palpation at the medial calcaneal tubercale and through the insertion of the plantar fascia of the bilateral feet. All other joints range of motion within normal limits bilateral. Strength 5/5 in all groups bilateral.   Radiographic exam: Normal osseous mineralization. Joint spaces preserved. No fracture/dislocation/boney destruction. Calcaneal spur present with mild thickening of plantar fascia bilateral. No other soft tissue abnormalities or radiopaque foreign bodies.   Assessment: #1 plantar fasciitis bilateral feet #2 pain in bilateral feet #3 peroneal enthesopathy  Plan of Care:  1. Patient evaluated. Xrays reviewed.   2. Injection of 0.5cc Celestone soluspan injected into the bilateral heels. 3. Injection of 0.5cc Celestone soluspan injected into insertion of peroneal tendon of the right foot. Care was taken to avoid direct injection into the tendon.   4. Instructed patient regarding therapies and modalities at home to alleviate symptoms.  5. Rx for Meloxicam  PO given to patient.  6. Rx for Medrol Dosepak given. 7. Plantar fascial band(s) dispensed for  bilateral plantar fasciitis. 8. Return to clinic in 3 weeks.    Patient works in [place]. Leaving May 19th.  Felecia Shelling, DPM [facility]  Dr. Felecia Shelling, DPM    [address]                                        [place], [place] [address]                Office [phone]  Fax [phone]

## 2016-07-31 MED ORDER — BETAMETHASONE SOD PHOS & ACET 6 (3-3) MG/ML IJ SUSP: 3.0000 mg | Freq: Once | INTRAMUSCULAR | Status: AC

## 2016-08-16 ENCOUNTER — Ambulatory Visit (INDEPENDENT_AMBULATORY_CARE_PROVIDER_SITE_OTHER): Payer: Managed Care, Other (non HMO) | Admitting: Podiatry

## 2016-08-16 DIAGNOSIS — M722 Plantar fascial fibromatosis: Secondary | ICD-10-CM

## 2016-08-16 DIAGNOSIS — M7671 Peroneal tendinitis, right leg: Secondary | ICD-10-CM

## 2016-08-16 MED ORDER — MELOXICAM 15 MG PO TABS: 15.0000 mg | ORAL_TABLET | Freq: Every day | ORAL | 3 refills | Status: DC

## 2016-08-17 NOTE — Progress Notes (Signed)
   Subjective: Patient presents today for follow-up evaluation of bilateral plantar fasciitis and peroneal enthesopathy. She states she is doing much better however she is still having pain in the right foot. She reports the injections and taking the meloxicam has helped alleviate the pain.  Objective: Physical Exam General: The patient is alert and oriented x3 in no acute distress.  Dermatology: Skin is warm, dry and supple bilateral lower extremities. Negative for open lesions or macerations bilateral.   Vascular: Dorsalis Pedis and Posterior Tibial pulses palpable bilateral.  Capillary fill time is immediate to all digits.  Neurological: Epicritic and protective threshold intact bilateral.   Musculoskeletal: Tenderness to palpation at the medial calcaneal tubercale and through the insertion of the plantar fascia of the bilateral feet. All other joints range of motion within normal limits bilateral. Strength 5/5 in all groups bilateral.     Assessment: #1 plantar fasciitis bilateral feet #2 pain in bilateral feet #3 peroneal enthesopathy/right tendinitis  Plan of Care:  1. Patient evaluated. 2. Injection of 0.5cc Celestone soluspan injected into the bilateral heels. 3. Injection of 0.5cc Celestone soluspan injected into insertion of peroneal tendon of the right foot. Care was taken to avoid direct injection into the tendon.   4. Instructed patient regarding therapies and modalities at home to alleviate symptoms.  5. Refill prescription for Meloxicam 15mg  PO given to patient.  6. Return to clinic when necessary.   Felecia ShellingBrent M. Adalid Beckmann, DPM [facility]  Dr. Felecia ShellingBrent M. Tameka Hoiland, DPM    [address]                                        [place], [place] [address]                Office [phone]  Fax [phone](336) [phone]

## 2016-08-20 MED ORDER — BETAMETHASONE SOD PHOS & ACET 6 (3-3) MG/ML IJ SUSP: 3.0000 mg | Freq: Once | INTRAMUSCULAR | Status: AC

## 2017-02-21 ENCOUNTER — Ambulatory Visit: Payer: Managed Care, Other (non HMO) | Admitting: Podiatry

## 2017-02-21 ENCOUNTER — Ambulatory Visit (INDEPENDENT_AMBULATORY_CARE_PROVIDER_SITE_OTHER): Payer: Managed Care, Other (non HMO) | Admitting: Podiatry

## 2017-02-21 DIAGNOSIS — M722 Plantar fascial fibromatosis: Secondary | ICD-10-CM

## 2017-02-21 DIAGNOSIS — M7672 Peroneal tendinitis, left leg: Secondary | ICD-10-CM | POA: Diagnosis not present

## 2017-02-22 ENCOUNTER — Ambulatory Visit
Admission: RE | Admit: 2017-02-22 | Discharge: 2017-02-22 | Disposition: A | Discharge status: Home / Self Care | Payer: Managed Care, Other (non HMO) | Source: Ambulatory Visit | Attending: Nurse Practitioner | Admitting: Nurse Practitioner

## 2017-02-22 DIAGNOSIS — Z1231 Encounter for screening mammogram for malignant neoplasm of breast: Secondary | ICD-10-CM | POA: Insufficient documentation

## 2017-02-26 NOTE — Progress Notes (Signed)
   Subjective: Patient presents today for follow-up evaluation of bilateral plantar fasciitis and peroneal enthesopathy. She reports diffuse pain to the plantar aspects of bilateral feet. She states this pain is worse in the mornings. She has been taking Meloxicam which helps alleviate the pain. She states the injections provided relief for nearly 6 weeks.  She also has a new complaint of burning pain to the lateral side of the left foot. She denies modifying factors. Patient is here for further evaluation and treatment.    Past Medical History:  Diagnosis Date  . Endometriosis, cervix 2010   [place]  . GERD (gastroesophageal reflux disease)     Objective: Physical Exam General: The patient is alert and oriented x3 in no acute distress.  Dermatology: Skin is warm, dry and supple bilateral lower extremities. Negative for open lesions or macerations bilateral.   Vascular: Dorsalis Pedis and Posterior Tibial pulses palpable bilateral.  Capillary fill time is immediate to all digits.  Neurological: Epicritic and protective threshold intact bilateral.   Musculoskeletal: Tenderness to palpation at the medial calcaneal tubercale and through the insertion of the plantar fascia of the bilateral feet. Tenderness to palpation along the peroneal tendons of the left foot. All other joints range of motion within normal limits bilateral. Strength 5/5 in all groups bilateral.     Assessment: #1 plantar fasciitis bilateral feet #2 pain in bilateral feet #3 peroneal tendinitis left  Plan of Care:  - Patient evaluated. - Injection of 0.5cc Celestone soluspan injected into the bilateral heels. - Injection of 0.5cc Celestone soluspan injected into insertion of peroneal tendon of the right foot. Care was taken to avoid direct injection into the tendon. - Continue wearing good, supportive shoes. - Request preauthorization for custom molded orthotics. Will call. - Continue taking Meloxicam as  needed. - Return to clinic in 3 weeks.  Leaves for [place] on December 4th.    Felecia ShellingBrent M. Aariyana Manz, DPM [facility]  Dr. Felecia ShellingBrent M. Eliora Nienhuis, DPM    [address]                                        [place], [place] [address]                Office [phone]  Fax [phone](336) [phone]

## 2017-03-03 ENCOUNTER — Encounter: Payer: Self-pay | Admitting: Podiatry

## 2017-03-03 ENCOUNTER — Ambulatory Visit (INDEPENDENT_AMBULATORY_CARE_PROVIDER_SITE_OTHER): Payer: Managed Care, Other (non HMO) | Admitting: Podiatry

## 2017-03-03 DIAGNOSIS — M722 Plantar fascial fibromatosis: Secondary | ICD-10-CM | POA: Diagnosis not present

## 2017-03-06 NOTE — Progress Notes (Signed)
   HPI: 41 year old female presenting today for follow up evaluation of bilateral plantar fasciitis and left peroneal tendinitis. She states she is doing better with significant relief of the symptoms. She reports her insurance will not cover custom molded orthotics. She reports she has been taking Meloxicam which has helped relieve the pain as well as the injection she received at the previous visit. Patient is here for further evaluation and treatment.   Past Medical History:  Diagnosis Date  . Endometriosis, cervix 2010   [place]  . GERD (gastroesophageal reflux disease)      Physical Exam: General: The patient is alert and oriented x3 in no acute distress.  Dermatology: Skin is warm, dry and supple bilateral lower extremities. Negative for open lesions or macerations.  Vascular: Palpable pedal pulses bilaterally. No edema or erythema noted. Capillary refill within normal limits.  Neurological: Epicritic and protective threshold grossly intact bilaterally.   Musculoskeletal Exam: Range of motion within normal limits to all pedal and ankle joints bilateral. Muscle strength 5/5 in all groups bilateral.   Assessment: - bilateral plantar fasciitis - resolved - left peroneal tendinitis - resolved   Plan of Care:  - Patient reviewed. - Continue wearing good shoe gear. - Recommended OTC insoles from [employer]. Insurance would not pay for custom molded orthotics.  - Return to clinic when necessary.  Leaves for [place] on December 4th.   Felecia ShellingBrent M. Amiley Shishido, DPM [facility]  Dr. Felecia ShellingBrent M. Priyana Mccarey, DPM    [address]                                        [place], [place] [address]                Office [phone]  Fax [phone]

## 2017-03-07 ENCOUNTER — Ambulatory Visit: Payer: Managed Care, Other (non HMO) | Admitting: Podiatry

## 2017-07-18 ENCOUNTER — Ambulatory Visit (INDEPENDENT_AMBULATORY_CARE_PROVIDER_SITE_OTHER): Payer: Managed Care, Other (non HMO) | Admitting: Podiatry

## 2017-07-18 ENCOUNTER — Encounter: Payer: Self-pay | Admitting: Podiatry

## 2017-07-18 DIAGNOSIS — M722 Plantar fascial fibromatosis: Secondary | ICD-10-CM | POA: Diagnosis not present

## 2017-07-18 DIAGNOSIS — M7672 Peroneal tendinitis, left leg: Secondary | ICD-10-CM | POA: Diagnosis not present

## 2017-07-18 MED ORDER — DICLOFENAC SODIUM 75 MG PO TBEC: 75.0000 mg | DELAYED_RELEASE_TABLET | Freq: Two times a day (BID) | ORAL | 1 refills | Status: DC

## 2017-07-18 MED ORDER — METHYLPREDNISOLONE 4 MG PO TBPK: ORAL_TABLET | ORAL | 0 refills | Status: DC

## 2017-07-25 NOTE — Progress Notes (Signed)
   Subjective: Patient presents today for follow up evaluation of a plantar fasciitis flare up that began a few weeks ago. She reports burning pain to the lateral side of the left foot as well. She states she received relief from the injections for about one month. She has been taking Meloxicam for treatment. She states she is going out of town next week and will be on her feet for extended periods of time. Patient is here for further evaluation and treatment.   Past Medical History:  Diagnosis Date  . Endometriosis, cervix 2010   [place]  . GERD (gastroesophageal reflux disease)      Objective: Physical Exam General: The patient is alert and oriented x3 in no acute distress.  Dermatology: Skin is warm, dry and supple bilateral lower extremities. Negative for open lesions or macerations bilateral.   Vascular: Dorsalis Pedis and Posterior Tibial pulses palpable bilateral.  Capillary fill time is immediate to all digits.  Neurological: Epicritic and protective threshold intact bilateral.   Musculoskeletal: Tenderness to palpation to the plantar aspect of the bilateral heels along the plantar fascia. Pain with palpation to the peroneal tendon of the left foot. All other joints range of motion within normal limits bilateral. Strength 5/5 in all groups bilateral.   Assessment: 1. plantar fasciitis bilateral feet 2. Peroneal tendinitis left   Plan of Care:  1. Patient evaluated.    2. Injection of 0.5cc Celestone soluspan injected into the bilateral heels.  3. Injection of 0.5 mLs Celestone Soluspan injected into the peroneal tendon sheath of the left foot.  4. Rx for Medrol Dose Pak placed 5. Rx for Diclofenac 75mg  PO BID ordered for patient. 6. Instructed patient regarding therapies and modalities at home to alleviate symptoms.  7. Return to clinic in 2 weeks.    Leaves for [place] on 4/30. Going to [place] and [place] with son, Nedra HaiLee, for school.   Felecia ShellingBrent M. Ranika Mcniel, DPM [facility]  Dr. Felecia ShellingBrent M. Jakeob Tullis, DPM    [address]                                   [place], [place] [address]                Office [phone](336) [phone]  Fax [phone](336) [phone]

## 2017-08-01 ENCOUNTER — Ambulatory Visit: Payer: Managed Care, Other (non HMO) | Admitting: Podiatry

## 2017-09-08 ENCOUNTER — Encounter: Payer: Self-pay | Admitting: Podiatry

## 2017-09-08 NOTE — Progress Notes (Signed)
Pt's medical records requested by [facility] were uploaded to their online portal per their request.

## 2017-09-26 ENCOUNTER — Ambulatory Visit (INDEPENDENT_AMBULATORY_CARE_PROVIDER_SITE_OTHER): Payer: Managed Care, Other (non HMO) | Admitting: Podiatry

## 2017-09-26 DIAGNOSIS — M7672 Peroneal tendinitis, left leg: Secondary | ICD-10-CM

## 2017-09-26 DIAGNOSIS — M722 Plantar fascial fibromatosis: Secondary | ICD-10-CM

## 2017-09-26 MED ORDER — DICLOFENAC SODIUM 75 MG PO TBEC: 75.0000 mg | DELAYED_RELEASE_TABLET | Freq: Two times a day (BID) | ORAL | 1 refills | Status: DC

## 2017-09-28 NOTE — Progress Notes (Signed)
   Subjective: 42 year old female presenting today for follow up evaluation of bilateral foot pain. She states the pain has worsened now than at her previous visit. She [place] and walked excessively which has exacerbated the pain. She notes burning to the lateral side of the left foot. She has been taking Diclofenac for pain which helps alleviate it. Patient is here for further evaluation and treatment.   Past Medical History:  Diagnosis Date  . Endometriosis, cervix 2010   [place]  . GERD (gastroesophageal reflux disease)      Objective: Physical Exam General: The patient is alert and oriented x3 in no acute distress.  Dermatology: Skin is warm, dry and supple bilateral lower extremities. Negative for open lesions or macerations bilateral.   Vascular: Dorsalis Pedis and Posterior Tibial pulses palpable bilateral.  Capillary fill time is immediate to all digits.  Neurological: Epicritic and protective threshold intact bilateral.   Musculoskeletal: Tenderness to palpation to the plantar aspect of the bilateral heels along the plantar fascia. Pain with palpation to the peroneal tendon of the left foot. All other joints range of motion within normal limits bilateral. Strength 5/5 in all groups bilateral.   Assessment: 1. plantar fasciitis bilateral feet 2. Peroneal tendinitis left   Plan of Care:  1. Patient evaluated.    2. Injection of 0.5 mLs Celestone Soluspan injected into the peroneal tendon sheath of the left foot.  3. Refill prescription for Diclofenac provided to patient.  4. Recommended good shoe gear.  5. Return to clinic as needed.     Leaves for [place] through December. Son's name is Nedra HaiLee.   Felecia ShellingBrent M. Letanya Froh, DPM [facility]  Dr. Felecia ShellingBrent M. Kendell Sagraves, DPM    [address]                                   [place], [place] [address]                Office [phone](336) [phone]  Fax [phone]([phone]

## 2018-02-28 ENCOUNTER — Telehealth: Payer: Self-pay

## 2018-02-28 NOTE — Telephone Encounter (Signed)
Faxed refill request for Diclofenac 75mg  denied. Patient needs office visit.

## 2018-04-25 ENCOUNTER — Other Ambulatory Visit: Payer: Self-pay | Admitting: Podiatry

## 2018-05-29 ENCOUNTER — Ambulatory Visit (INDEPENDENT_AMBULATORY_CARE_PROVIDER_SITE_OTHER): Payer: Managed Care, Other (non HMO)

## 2018-05-29 ENCOUNTER — Encounter: Payer: Self-pay | Admitting: Podiatry

## 2018-05-29 ENCOUNTER — Ambulatory Visit (INDEPENDENT_AMBULATORY_CARE_PROVIDER_SITE_OTHER): Payer: Managed Care, Other (non HMO) | Admitting: Podiatry

## 2018-05-29 DIAGNOSIS — M21612 Bunion of left foot: Secondary | ICD-10-CM

## 2018-05-29 DIAGNOSIS — G5791 Unspecified mononeuropathy of right lower limb: Secondary | ICD-10-CM | POA: Diagnosis not present

## 2018-05-29 DIAGNOSIS — M7752 Other enthesopathy of left foot: Secondary | ICD-10-CM

## 2018-05-29 MED ORDER — GABAPENTIN 100 MG PO CAPS
100.0000 mg | ORAL_CAPSULE | Freq: Three times a day (TID) | ORAL | 3 refills | Status: DC
Start: 2018-05-29 — End: 2021-08-09

## 2018-05-31 NOTE — Progress Notes (Signed)
   HPI: 43 year old female presenting today for follow up evaluation of plantar fasciitis of the bilateral feet. She states the pain resolved for several weeks but has now returned in the last month. She states the pain is located around the 1st MPJ of the left foot and the lateral aspect of the right foot. She was taking Diclofenac as directed but has reports she needs a refill. Walking increases the pain. Patient is here for further evaluation and treatment.    Past Medical History:  Diagnosis Date  . Endometriosis, cervix 2010   [place]  . GERD (gastroesophageal reflux disease)      Physical Exam: General: The patient is alert and oriented x3 in no acute distress.  Dermatology: Skin is warm, dry and supple bilateral lower extremities. Negative for open lesions or macerations.  Vascular: Palpable pedal pulses bilaterally. No edema or erythema noted. Capillary refill within normal limits.  Neurological: Neuro: Epicritic and protective threshold intact.  Paresthesia with burning, shooting pain noted to the right foot.    Musculoskeletal Exam: Pain with palpation noted to the 1st MPJ of the left foot. Range of motion within normal limits to all pedal and ankle joints bilateral. Muscle strength 5/5 in all groups bilateral.   Assessment: 1. Neuritis right foot.  2. 1st MPJ capsulitis left    Plan of Care:  1. Patient evaluated.   2. Injection of 0.5 mLs Celestone Soluspan injected into the 1st MPJ of the left foot.  3. Prescription for Gabapentin 100 mg #90 provided to patient.  4. We will call in Diclofenac refill as needed.  5. Return to clinic as needed.      Felecia Shelling, DPM [facility]  Dr. Felecia Shelling, DPM    [address] [place], [place] [address]                Office [phone]  Fax [phone]

## 2018-06-30 ENCOUNTER — Other Ambulatory Visit: Payer: Self-pay | Admitting: Podiatry

## 2018-09-12 ENCOUNTER — Other Ambulatory Visit: Payer: Self-pay

## 2018-09-12 MED ORDER — DICLOFENAC SODIUM 75 MG PO TBEC: 75.0000 mg | DELAYED_RELEASE_TABLET | Freq: Two times a day (BID) | ORAL | 1 refills | Status: DC

## 2018-09-12 NOTE — Telephone Encounter (Signed)
Pharmacy refill request for Diclofenac75mg   Per Dr. Evans verbal order, ok to refill.   Script has been sent to pharmacy 

## 2018-12-04 ENCOUNTER — Encounter: Payer: Managed Care, Other (non HMO) | Admitting: Obstetrics & Gynecology

## 2018-12-13 ENCOUNTER — Ambulatory Visit (INDEPENDENT_AMBULATORY_CARE_PROVIDER_SITE_OTHER): Payer: Managed Care, Other (non HMO) | Admitting: Obstetrics & Gynecology

## 2018-12-13 ENCOUNTER — Other Ambulatory Visit: Payer: Self-pay

## 2018-12-13 ENCOUNTER — Encounter: Payer: Self-pay | Admitting: Obstetrics & Gynecology

## 2018-12-13 ENCOUNTER — Other Ambulatory Visit (HOSPITAL_COMMUNITY)
Admission: RE | Admit: 2018-12-13 | Discharge: 2018-12-13 | Disposition: A | Discharge status: Home / Self Care | Payer: Managed Care, Other (non HMO) | Source: Ambulatory Visit | Attending: Obstetrics & Gynecology | Admitting: Obstetrics & Gynecology

## 2018-12-13 VITALS — BP 140/80 | Ht 63.0 in | Wt 243.0 lb

## 2018-12-13 DIAGNOSIS — Z3049 Encounter for surveillance of other contraceptives: Secondary | ICD-10-CM | POA: Diagnosis not present

## 2018-12-13 DIAGNOSIS — Z01419 Encounter for gynecological examination (general) (routine) without abnormal findings: Secondary | ICD-10-CM

## 2018-12-13 DIAGNOSIS — Z1239 Encounter for other screening for malignant neoplasm of breast: Secondary | ICD-10-CM

## 2018-12-13 DIAGNOSIS — Z30017 Encounter for initial prescription of implantable subdermal contraceptive: Secondary | ICD-10-CM

## 2018-12-13 DIAGNOSIS — Z124 Encounter for screening for malignant neoplasm of cervix: Secondary | ICD-10-CM

## 2018-12-13 DIAGNOSIS — Z3046 Encounter for surveillance of implantable subdermal contraceptive: Secondary | ICD-10-CM

## 2018-12-13 NOTE — Patient Instructions (Signed)
PAP every three years Mammogram every year    Call [phone] to schedule at [facility] Instructions After Insertion   Keep bandage clean and dry for 24 hours   May use ice/Tylenol/Ibuprofen for soreness or pain   If you develop fever, drainage or increased warmth from incision site-contact office immediately

## 2018-12-13 NOTE — Progress Notes (Signed)
HPI:      [name] is a 43 y.o. G4P1 who LMP was No LMP recorded. Patient has had an implant., she presents today for her annual examination. The patient has no complaints today. The patient is sexually active. Her last pap: approximate date 2017 and was normal and last mammogram: approximate date 2 years ago and was abnormal: she had benign breast biopsy then by Dr Bary Castilla. The patient does perform self breast exams.  There is no notable family history of breast or ovarian cancer in her family.  The patient has regular exercise: yes.  The patient denies current symptoms of depression.    Pt currently works in [place] most of the year  GYN History: Contraception: Nexplanon     No periods, does well with it for several years (changing every 3)    Due for change soon, and is traveling back to [place] soon  PMHx: Past Medical History:  Diagnosis Date  . Endometriosis, cervix 2010   [place]  . GERD (gastroesophageal reflux disease)    Past Surgical History:  Procedure Laterality Date  . BREAST SURGERY Left 04/18/2013   vacuum biopsy left upper outer quadrant lesion, fibroadenoma  . DILATION AND CURETTAGE OF UTERUS  2010  . ESOPHAGOGASTRODUODENOSCOPY (EGD) WITH PROPOFOL N/A 12/18/2015   Procedure: ESOPHAGOGASTRODUODENOSCOPY (EGD) WITH PROPOFOL;  Surgeon: Taya Ashbaugh Bellow, MD;  Location: [facility] ENDOSCOPY;  Service: Endoscopy;  Laterality: N/A;  . laproscopy  01/2009   Family History  Problem Relation Age of Onset  . Non-Hodgkin's lymphoma Maternal Grandmother   . Breast cancer Neg Hx    Social History   Tobacco Use  . Smoking status: Never Smoker  . Smokeless tobacco: Never Used  Substance Use Topics  . Alcohol use: No  . Drug use: No    Current Outpatient Medications:  .  diclofenac (VOLTAREN) 75 MG EC tablet, Take 1 tablet (75 mg total) by mouth 2 (two) times daily., Disp: 120 tablet, Rfl: 1 .  esomeprazole (NEXIUM) 20 MG capsule, Take by mouth., Disp: ,  Rfl:  .  etonogestrel (NEXPLANON) 68 MG IMPL implant, Inject 1 each into the skin once., Disp: , Rfl:  .  gabapentin (NEURONTIN) 100 MG capsule, Take 1 capsule (100 mg total) by mouth 3 (three) times daily., Disp: 90 capsule, Rfl: 3 .  valACYclovir (VALTREX) 500 MG tablet, TAKE 1 TABLET ORALLY DAILY, Disp: , Rfl: 11 .  meloxicam (MOBIC) 15 MG tablet, Take 1 tablet (15 mg total) by mouth daily. (Patient not taking: Reported on 12/13/2018), Disp: 90 tablet, Rfl: 3 .  methylPREDNISolone (MEDROL DOSEPAK) 4 MG TBPK tablet, 6 day dose pack - take as directed (Patient not taking: Reported on 12/13/2018), Disp: 21 tablet, Rfl: 0  Current Facility-Administered Medications:  .  betamethasone acetate-betamethasone sodium phosphate (CELESTONE) injection 3 mg, 3 mg, Intramuscular, Once, Evans, Brent M, DPM .  betamethasone acetate-betamethasone sodium phosphate (CELESTONE) injection 3 mg, 3 mg, Intramuscular, Once, Edrick Kins, DPM Allergies: Patient has no known allergies.  Review of Systems  Constitutional: Negative for chills, fever and malaise/fatigue.  HENT: Negative for congestion, sinus pain and sore throat.   Eyes: Negative for blurred vision and pain.  Respiratory: Negative for cough and wheezing.   Cardiovascular: Negative for chest pain and leg swelling.  Gastrointestinal: Negative for abdominal pain, constipation, diarrhea, heartburn, nausea and vomiting.  Genitourinary: Negative for dysuria, frequency, hematuria and urgency.  Musculoskeletal: Negative for back pain, joint pain, myalgias and neck pain.  Skin: Negative for itching and rash.  Neurological: Negative for dizziness, tremors and weakness.  Endo/Heme/Allergies: Does not bruise/bleed easily.  Psychiatric/Behavioral: Negative for depression. The patient is not nervous/anxious and does not have insomnia.     Objective: BP 140/80   Ht 5\' 3"  (1.6 m)   Wt 243 lb (110.2 kg)   BMI 43.05 kg/m   Filed Weights   12/13/18 0927   Weight: 243 lb (110.2 kg)   Body mass index is 43.05 kg/m. Physical Exam Constitutional:      General: She is not in acute distress.    Appearance: She is well-developed.  Genitourinary:     Pelvic exam was performed with patient supine.     Vagina, uterus and rectum normal.     No lesions in the vagina.     No vaginal bleeding.     No cervical motion tenderness, friability, lesion or polyp.     Uterus is mobile.     Uterus is not enlarged.     No uterine mass detected.    Uterus is midaxial.     No right or left adnexal mass present.     Right adnexa not tender.     Left adnexa not tender.  HENT:     Head: Normocephalic and atraumatic. No laceration.     Right Ear: Hearing normal.     Left Ear: Hearing normal.     Mouth/Throat:     Pharynx: Uvula midline.  Eyes:     Pupils: Pupils are equal, round, and reactive to light.  Neck:     Musculoskeletal: Normal range of motion and neck supple.     Thyroid: No thyromegaly.  Cardiovascular:     Rate and Rhythm: Normal rate and regular rhythm.     Heart sounds: No murmur. No friction rub. No gallop.   Pulmonary:     Effort: Pulmonary effort is normal. No respiratory distress.     Breath sounds: Normal breath sounds. No wheezing.  Chest:     Breasts:        Right: No mass, skin change or tenderness.        Left: No mass, skin change or tenderness.  Abdominal:     General: Bowel sounds are normal. There is no distension.     Palpations: Abdomen is soft.     Tenderness: There is no abdominal tenderness. There is no rebound.  Musculoskeletal: Normal range of motion.  Neurological:     Mental Status: She is alert and oriented to person, place, and time.     Cranial Nerves: No cranial nerve deficit.  Skin:    General: Skin is warm and dry.  Psychiatric:        Judgment: Judgment normal.  Vitals signs reviewed.     Assessment:  ANNUAL EXAM 1. Nexplanon insertion   2. Nexplanon removal   3. Women's annual routine  gynecological examination   4. Screening for breast cancer   5. Screening for cervical cancer      Screening Plan:            1.  Cervical Screening-  Pap smear done today  2. Breast screening- Exam annually and mammogram>40 planned   3. Colonoscopy every 10 years, Hemoccult testing - after age 42  4. Labs managed by PCP  5. Counseling for contraception: Desires to continue w Nexplanon   Nexplanon removal Procedure note - The Nexplanon was noted in the patient's arm and the end was identified. The skin  was cleansed with a Betadine solution. A small injection of subcutaneous lidocaine with epinephrine was given over the end of the implant. An incision was made at the end of the implant. The rod was noted in the incision and grasped with a hemostat. It was noted to be intact.  Steri-Strip was placed approximating the incision. Hemostasis was noted.   Nexplanon Insertion Patient given informed consent, signed copy in the chart, time out was performed. Appropriate time out taken.  Patient's LEFT arm was prepped and draped in the usual sterile fashion.. The ruler used to measure and mark insertion area.  Pt was prepped with betadine swab and then injected with 10 cc of 2% lidocaine with epinephrine. Nexplanon removed form packaging,  Device confirmed in needle, then inserted full length of needle and withdrawn per handbook instructions.  Pt insertion site covered with steri-strip and a bandage.   Minimal blood loss.  Pt tolerated the procedure well.      F/U  Return in about 1 year (around 12/13/2019) for Annual.  Annamarie Major, MD, [facility], [facility] 12/13/2018  10:00 AM

## 2018-12-17 LAB — CYTOLOGY - PAP
Adequacy: ABSENT
Diagnosis: NEGATIVE
HPV: NOT DETECTED

## 2018-12-31 ENCOUNTER — Other Ambulatory Visit: Payer: Self-pay | Admitting: Podiatry

## 2019-06-07 ENCOUNTER — Other Ambulatory Visit: Payer: Self-pay | Admitting: Obstetrics & Gynecology

## 2019-06-07 ENCOUNTER — Telehealth: Payer: Self-pay

## 2019-06-07 NOTE — Telephone Encounter (Signed)
-----   Message from Nadara Mustard, MD sent at 06/07/2019  7:44 AM EST ----- Regarding: MMG Received notice she has not received MMG yet as ordered at her Annual. Please check and encourage her to do this, and document conversation.

## 2019-06-07 NOTE — Telephone Encounter (Signed)
left message to advise pt to schedule mammo

## 2019-06-10 ENCOUNTER — Other Ambulatory Visit: Payer: Self-pay | Admitting: Obstetrics & Gynecology

## 2019-06-10 ENCOUNTER — Telehealth: Payer: Self-pay | Admitting: Obstetrics & Gynecology

## 2019-06-10 DIAGNOSIS — Z1231 Encounter for screening mammogram for malignant neoplasm of breast: Secondary | ICD-10-CM

## 2019-06-10 NOTE — Telephone Encounter (Signed)
Pt called, she needs new orders for mammo. [place] year and couldn't scheduled before hand. Per [place] they need new orders for mammo. Thank you

## 2019-06-10 NOTE — Telephone Encounter (Signed)
Left message to advise pt mammo order in

## 2019-06-21 ENCOUNTER — Ambulatory Visit (INDEPENDENT_AMBULATORY_CARE_PROVIDER_SITE_OTHER): Payer: Managed Care, Other (non HMO) | Admitting: Podiatry

## 2019-06-21 ENCOUNTER — Other Ambulatory Visit: Payer: Self-pay

## 2019-06-21 ENCOUNTER — Encounter: Payer: Self-pay | Admitting: Podiatry

## 2019-06-21 VITALS — Temp 98.2°F | Resp 16

## 2019-06-21 DIAGNOSIS — M659 Synovitis and tenosynovitis, unspecified: Secondary | ICD-10-CM

## 2019-06-21 DIAGNOSIS — G5791 Unspecified mononeuropathy of right lower limb: Secondary | ICD-10-CM | POA: Diagnosis not present

## 2019-06-21 DIAGNOSIS — M722 Plantar fascial fibromatosis: Secondary | ICD-10-CM

## 2019-06-21 DIAGNOSIS — M7752 Other enthesopathy of left foot: Secondary | ICD-10-CM | POA: Diagnosis not present

## 2019-06-24 NOTE — Progress Notes (Signed)
   Subjective:  44 y.o. female presenting today for follow up evaluation of bilateral foot pain. She reports some continued burning pain. She states the injections she received at her last appointment provided relief for about one month. She has been taking Gabapentin and Diclofenac as directed. There are no worsening factors noted. Patient is here for further evaluation and treatment.   Past Medical History:  Diagnosis Date  . Endometriosis, cervix 2010   [place]  . GERD (gastroesophageal reflux disease)      Objective / Physical Exam:  General:  The patient is alert and oriented x3 in no acute distress. Dermatology:  Skin is warm, dry and supple bilateral lower extremities. Negative for open lesions or macerations. Vascular:  Palpable pedal pulses bilaterally. No edema or erythema noted. Capillary refill within normal limits. Neurological:  Epicritic and protective threshold intact. Burning sensation noted to the right lateral ankle.  Musculoskeletal Exam:  Pain on palpation to the anterior lateral medial aspects of the patient's right ankle. Mild edema noted. Range of motion within normal limits to all pedal and ankle joints bilateral. Muscle strength 5/5 in all groups bilateral.   Assessment: 1. Right ankle synovitis - lateral  2. Neuritis right lateral ankle  3. Plantar fasciitis bilateral - resolved  4. 1st MPJ capsulitis left - resolved   Plan of Care:  1. Patient was evaluated. 2. Injection of 0.5 mL Celestone Soluspan injected in the patient's right ankle. 3. Continue taking Diclofenac twice daily. 4. Continue taking Gabapentin every night at bedtime.  5. Recommended good shoe gear.  6. Return to clinic in 4 weeks.   Nedra Hai is her son. Just got a job at [employer].    Felecia Shelling, DPM [facility]  Dr. Felecia Shelling, DPM    [address]                                        [place], [place] [address]                Office [phone]   Fax [phone]

## 2019-06-27 ENCOUNTER — Other Ambulatory Visit: Payer: Self-pay | Admitting: Obstetrics & Gynecology

## 2019-06-27 DIAGNOSIS — Z1231 Encounter for screening mammogram for malignant neoplasm of breast: Secondary | ICD-10-CM

## 2019-07-23 ENCOUNTER — Ambulatory Visit: Payer: Managed Care, Other (non HMO) | Admitting: Podiatry

## 2019-10-09 DIAGNOSIS — R079 Chest pain, unspecified: Secondary | ICD-10-CM | POA: Diagnosis not present

## 2019-10-09 DIAGNOSIS — R0602 Shortness of breath: Secondary | ICD-10-CM | POA: Diagnosis not present

## 2019-10-09 DIAGNOSIS — R197 Diarrhea, unspecified: Secondary | ICD-10-CM | POA: Diagnosis not present

## 2019-10-09 DIAGNOSIS — G5603 Carpal tunnel syndrome, bilateral upper limbs: Secondary | ICD-10-CM | POA: Diagnosis not present

## 2019-10-09 DIAGNOSIS — I209 Angina pectoris, unspecified: Secondary | ICD-10-CM | POA: Diagnosis not present

## 2019-10-09 DIAGNOSIS — M25511 Pain in right shoulder: Secondary | ICD-10-CM | POA: Diagnosis not present

## 2019-10-09 DIAGNOSIS — R609 Edema, unspecified: Secondary | ICD-10-CM | POA: Diagnosis not present

## 2019-10-09 DIAGNOSIS — R002 Palpitations: Secondary | ICD-10-CM | POA: Diagnosis not present

## 2019-10-11 DIAGNOSIS — R197 Diarrhea, unspecified: Secondary | ICD-10-CM | POA: Diagnosis not present

## 2019-10-15 DIAGNOSIS — I209 Angina pectoris, unspecified: Secondary | ICD-10-CM | POA: Diagnosis not present

## 2019-10-16 DIAGNOSIS — R609 Edema, unspecified: Secondary | ICD-10-CM | POA: Diagnosis not present

## 2019-10-22 DIAGNOSIS — B009 Herpesviral infection, unspecified: Secondary | ICD-10-CM | POA: Diagnosis not present

## 2019-10-22 DIAGNOSIS — I10 Essential (primary) hypertension: Secondary | ICD-10-CM | POA: Diagnosis not present

## 2019-10-22 DIAGNOSIS — R197 Diarrhea, unspecified: Secondary | ICD-10-CM | POA: Diagnosis not present

## 2019-10-22 DIAGNOSIS — M25511 Pain in right shoulder: Secondary | ICD-10-CM | POA: Diagnosis not present

## 2019-10-22 DIAGNOSIS — K229 Disease of esophagus, unspecified: Secondary | ICD-10-CM | POA: Diagnosis not present

## 2019-10-23 DIAGNOSIS — E782 Mixed hyperlipidemia: Secondary | ICD-10-CM | POA: Diagnosis not present

## 2019-10-23 DIAGNOSIS — R079 Chest pain, unspecified: Secondary | ICD-10-CM | POA: Diagnosis not present

## 2019-10-23 DIAGNOSIS — B009 Herpesviral infection, unspecified: Secondary | ICD-10-CM | POA: Diagnosis not present

## 2019-10-23 DIAGNOSIS — K229 Disease of esophagus, unspecified: Secondary | ICD-10-CM | POA: Diagnosis not present

## 2019-10-23 DIAGNOSIS — R002 Palpitations: Secondary | ICD-10-CM | POA: Diagnosis not present

## 2019-10-23 DIAGNOSIS — I10 Essential (primary) hypertension: Secondary | ICD-10-CM | POA: Diagnosis not present

## 2019-10-23 DIAGNOSIS — M25511 Pain in right shoulder: Secondary | ICD-10-CM | POA: Diagnosis not present

## 2019-12-17 ENCOUNTER — Ambulatory Visit (INDEPENDENT_AMBULATORY_CARE_PROVIDER_SITE_OTHER): Payer: BLUE CROSS/BLUE SHIELD

## 2019-12-17 ENCOUNTER — Ambulatory Visit (INDEPENDENT_AMBULATORY_CARE_PROVIDER_SITE_OTHER): Payer: BLUE CROSS/BLUE SHIELD | Admitting: Podiatry

## 2019-12-17 ENCOUNTER — Other Ambulatory Visit: Payer: Self-pay

## 2019-12-17 DIAGNOSIS — S9002XA Contusion of left ankle, initial encounter: Secondary | ICD-10-CM

## 2019-12-17 DIAGNOSIS — M7672 Peroneal tendinitis, left leg: Secondary | ICD-10-CM

## 2019-12-17 MED ORDER — METHYLPREDNISOLONE 4 MG PO TBPK: ORAL_TABLET | ORAL | 0 refills | Status: DC

## 2019-12-17 NOTE — Progress Notes (Signed)
   HPI: 44 y.o. female presenting today for evaluation of left ankle pain that developed over the past 2-3 weeks.  She denies a history of injury but gradually she started to notice sharp pains in the left ankle.  It is now gotten worse.  She has not done anything for treatment.  She presents for further treatment evaluation  Past Medical History:  Diagnosis Date  . Endometriosis, cervix 2010   [place]  . GERD (gastroesophageal reflux disease)      Physical Exam: General: The patient is alert and oriented x3 in no acute distress.  Dermatology: Skin is warm, dry and supple bilateral lower extremities. Negative for open lesions or macerations.  Vascular: Palpable pedal pulses bilaterally. No edema or erythema noted. Capillary refill within normal limits.  Neurological: Epicritic and protective threshold grossly intact bilaterally.   Musculoskeletal Exam: Range of motion within normal limits to all pedal and ankle joints bilateral. Muscle strength 5/5 in all groups bilateral.  Pain on palpation just posterior to the fibular malleolus along the peroneal tendon sheath consistent with findings of peroneal tendinitis  Radiographic Exam:  Normal osseous mineralization. Joint spaces preserved. No fracture/dislocation/boney destruction.    Assessment: 1.  Peroneal tendinitis left   Plan of Care:  1. Patient evaluated. X-Rays reviewed.  2.  Compression anklet dispensed.  Wear daily. 3.  Cam boot dispensed.  Weightbearing as tolerated x2-3 weeks 4.  Prescription for Medrol Dosepak.  Once the patient completes she may resume diclofenac 75 mg 2 times daily 5.  Continue gabapentin as prescribed 6.  Return to clinic in 4 weeks  *Nedra Hai is her son.  Recently got a job at [employer].  Nedra Hai got a [employer]      Felecia Shelling, DPM [facility]  Dr. Felecia Shelling, DPM    [address] [place], [place] [address]                Office [phone]  Fax [phone]

## 2019-12-24 ENCOUNTER — Ambulatory Visit: Payer: Self-pay | Admitting: Podiatry

## 2020-01-06 DIAGNOSIS — Z23 Encounter for immunization: Secondary | ICD-10-CM | POA: Diagnosis not present

## 2020-01-14 ENCOUNTER — Ambulatory Visit: Payer: BLUE CROSS/BLUE SHIELD | Admitting: Podiatry

## 2020-01-27 DIAGNOSIS — Z1152 Encounter for screening for COVID-19: Secondary | ICD-10-CM | POA: Diagnosis not present

## 2020-02-20 ENCOUNTER — Other Ambulatory Visit: Payer: Self-pay | Admitting: Podiatry

## 2020-02-24 NOTE — Telephone Encounter (Signed)
Please advise 

## 2020-03-11 ENCOUNTER — Other Ambulatory Visit: Payer: Self-pay

## 2020-03-11 ENCOUNTER — Ambulatory Visit
Admission: RE | Admit: 2020-03-11 | Discharge: 2020-03-11 | Disposition: A | Discharge status: Home / Self Care | Payer: BC Managed Care – PPO | Source: Ambulatory Visit | Attending: Obstetrics & Gynecology | Admitting: Obstetrics & Gynecology

## 2020-03-11 DIAGNOSIS — Z1231 Encounter for screening mammogram for malignant neoplasm of breast: Secondary | ICD-10-CM | POA: Diagnosis not present

## 2020-04-09 DIAGNOSIS — Z0189 Encounter for other specified special examinations: Secondary | ICD-10-CM | POA: Diagnosis not present

## 2020-04-13 DIAGNOSIS — M79673 Pain in unspecified foot: Secondary | ICD-10-CM | POA: Diagnosis not present

## 2020-04-13 DIAGNOSIS — R002 Palpitations: Secondary | ICD-10-CM | POA: Diagnosis not present

## 2020-04-13 DIAGNOSIS — E669 Obesity, unspecified: Secondary | ICD-10-CM | POA: Diagnosis not present

## 2020-04-13 DIAGNOSIS — F411 Generalized anxiety disorder: Secondary | ICD-10-CM | POA: Diagnosis not present

## 2020-04-16 DIAGNOSIS — Z0189 Encounter for other specified special examinations: Secondary | ICD-10-CM | POA: Diagnosis not present

## 2020-04-17 DIAGNOSIS — Z20822 Contact with and (suspected) exposure to covid-19: Secondary | ICD-10-CM | POA: Diagnosis not present

## 2020-04-27 DIAGNOSIS — J301 Allergic rhinitis due to pollen: Secondary | ICD-10-CM | POA: Diagnosis not present

## 2020-04-27 DIAGNOSIS — H93292 Other abnormal auditory perceptions, left ear: Secondary | ICD-10-CM | POA: Diagnosis not present

## 2020-04-28 DIAGNOSIS — Z0189 Encounter for other specified special examinations: Secondary | ICD-10-CM | POA: Diagnosis not present

## 2020-04-29 DIAGNOSIS — R945 Abnormal results of liver function studies: Secondary | ICD-10-CM | POA: Diagnosis not present

## 2020-05-14 DIAGNOSIS — K219 Gastro-esophageal reflux disease without esophagitis: Secondary | ICD-10-CM | POA: Diagnosis not present

## 2020-05-14 DIAGNOSIS — K529 Noninfective gastroenteritis and colitis, unspecified: Secondary | ICD-10-CM | POA: Diagnosis not present

## 2020-05-14 DIAGNOSIS — K591 Functional diarrhea: Secondary | ICD-10-CM | POA: Diagnosis not present

## 2020-05-18 DIAGNOSIS — K529 Noninfective gastroenteritis and colitis, unspecified: Secondary | ICD-10-CM | POA: Diagnosis not present

## 2020-05-20 DIAGNOSIS — Z0189 Encounter for other specified special examinations: Secondary | ICD-10-CM | POA: Diagnosis not present

## 2020-05-26 DIAGNOSIS — Z01818 Encounter for other preprocedural examination: Secondary | ICD-10-CM | POA: Diagnosis not present

## 2020-05-29 DIAGNOSIS — K640 First degree hemorrhoids: Secondary | ICD-10-CM | POA: Diagnosis not present

## 2020-05-29 DIAGNOSIS — F419 Anxiety disorder, unspecified: Secondary | ICD-10-CM | POA: Diagnosis not present

## 2020-05-29 DIAGNOSIS — K591 Functional diarrhea: Secondary | ICD-10-CM | POA: Diagnosis not present

## 2020-05-29 DIAGNOSIS — K219 Gastro-esophageal reflux disease without esophagitis: Secondary | ICD-10-CM | POA: Diagnosis not present

## 2020-06-16 DIAGNOSIS — R059 Cough, unspecified: Secondary | ICD-10-CM | POA: Diagnosis not present

## 2020-06-16 DIAGNOSIS — U071 COVID-19: Secondary | ICD-10-CM | POA: Diagnosis not present

## 2020-06-17 ENCOUNTER — Other Ambulatory Visit: Payer: Self-pay | Admitting: Podiatry

## 2020-06-17 NOTE — Telephone Encounter (Signed)
Please advise 

## 2020-06-23 DIAGNOSIS — Z8616 Personal history of COVID-19: Secondary | ICD-10-CM | POA: Diagnosis not present

## 2020-07-03 NOTE — Progress Notes (Signed)
Robyn Loveless, MD   Chief Complaint  Patient presents with  . Vaginal Discharge    Irritation x 7 months, severe pain during intercourse, spotting during and after intercourse x 7 months    HPI:      Robyn Espinoza is a 45 y.o. G4P1 whose LMP was No LMP recorded. Patient has had an implant., presents today for chronic vaginal irritation with increased d/c, no odor, off and on for the past 7 months. Did BV tx last summer without sx relief. No yeast tx done. Has occas fishy odor but not consistent. Uses scented soap and dryer sheets. Robyn Espinoza is sex active, usually with dyspareunia due to endometriosis but had more vaginal pain with sex recently. No new partners.  Neg pap 9/20; has nexplanon. Has occas spotting with nexplanon, sometimes with sex.   Past Medical History:  Diagnosis Date  . Endometriosis, cervix 2010   [place]  . GERD (gastroesophageal reflux disease)     Past Surgical History:  Procedure Laterality Date  . BREAST SURGERY Left 04/18/2013   vacuum biopsy left upper outer quadrant lesion, fibroadenoma  . DILATION AND CURETTAGE OF UTERUS  2010  . ESOPHAGOGASTRODUODENOSCOPY (EGD) WITH PROPOFOL N/A 12/18/2015   Procedure: ESOPHAGOGASTRODUODENOSCOPY (EGD) WITH PROPOFOL;  Surgeon: Earline Mayotte, MD;  Location: [facility] ENDOSCOPY;  Service: Endoscopy;  Laterality: N/A;  . laproscopy  01/2009    Family History  Problem Relation Age of Onset  . Non-Hodgkin's lymphoma Maternal Grandmother   . Breast cancer Neg Hx     Social History   Socioeconomic History  . Marital status: Divorced    Spouse name: Not on file  . Number of children: Not on file  . Years of education: Not on file  . Highest education level: Not on file  Occupational History  . Not on file  Tobacco Use  . Smoking status: Never Smoker  . Smokeless tobacco: Never Used  Vaping Use  . Vaping Use: Never used  Substance and Sexual Activity  . Alcohol use: No  . Drug use: No  . Sexual activity:  Yes    Birth control/protection: Implant  Other Topics Concern  . Not on file  Social History Narrative  . Not on file   Social Determinants of Health   Financial Resource Strain: Not on file  Food Insecurity: Not on file  Transportation Needs: Not on file  Physical Activity: Not on file  Stress: Not on file  Social Connections: Not on file  Intimate Partner Violence: Not on file    Outpatient Medications Prior to Visit  Medication Sig Dispense Refill  . baclofen (LIORESAL) 10 MG tablet baclofen 10 mg tablet    . Cetirizine HCl (ZYRTEC PO) Take by mouth.    . diclofenac (VOLTAREN) 75 MG EC tablet TAKE 1 TABLET BY MOUTH TWICE A DAY 60 tablet 3  . esomeprazole (NEXIUM) 20 MG capsule Take by mouth.    . etonogestrel (NEXPLANON) 68 MG IMPL implant Inject 1 each into the skin once.    . gabapentin (NEURONTIN) 100 MG capsule Take 1 capsule (100 mg total) by mouth 3 (three) times daily. 90 capsule 3  . metoprolol succinate (TOPROL-XL) 25 MG 24 hr tablet Take 0.5 tablets by mouth daily.    . sertraline (ZOLOFT) 100 MG tablet Take 1 tablet by mouth at bedtime.    . valACYclovir (VALTREX) 500 MG tablet TAKE 1 TABLET ORALLY DAILY  11  . fexofenadine (ALLEGRA ALLERGY) 180 MG  tablet Allegra Allergy 180 mg tablet    . methylPREDNISolone (MEDROL DOSEPAK) 4 MG TBPK tablet 6 day dose pack - take as directed 21 tablet 0   Facility-Administered Medications Prior to Visit  Medication Dose Route Frequency Provider Last Rate Last Admin  . betamethasone acetate-betamethasone sodium phosphate (CELESTONE) injection 3 mg  3 mg Intramuscular Once Gala Lewandowsky M, DPM      . betamethasone acetate-betamethasone sodium phosphate (CELESTONE) injection 3 mg  3 mg Intramuscular Once Felecia Shelling, DPM          ROS:  Review of Systems  Constitutional: Negative for fever.  Gastrointestinal: Negative for blood in stool, constipation, diarrhea, nausea and vomiting.  Genitourinary: Positive for dyspareunia,  vaginal bleeding, vaginal discharge and vaginal pain. Negative for dysuria, flank pain, frequency, hematuria and urgency.  Musculoskeletal: Negative for back pain.  Skin: Negative for rash.   BREAST: No symptoms   OBJECTIVE:   Vitals:  BP 136/80   Ht 5\' 3"  (1.6 m)   Wt 250 lb (113.4 kg)   BMI 44.29 kg/m   Physical Exam Vitals reviewed.  Constitutional:      Appearance: Robyn Espinoza is well-developed.  Pulmonary:     Effort: Pulmonary effort is normal.  Genitourinary:    General: Normal vulva.     Pubic Area: No rash.      Labia:        Right: No rash, tenderness or lesion.        Left: No rash, tenderness or lesion.      Vagina: Normal. No vaginal discharge, erythema or tenderness.     Cervix: Normal.     Uterus: Normal. Not enlarged and not tender.      Adnexa: Right adnexa normal and left adnexa normal.       Right: No mass or tenderness.         Left: No mass or tenderness.    Musculoskeletal:        General: Normal range of motion.     Cervical back: Normal range of motion.  Skin:    General: Skin is warm and dry.  Neurological:     General: No focal deficit present.     Mental Status: Robyn Espinoza is alert and oriented to person, place, and time.  Psychiatric:        Mood and Affect: Mood normal.        Behavior: Behavior normal.        Thought Content: Thought content normal.        Judgment: Judgment normal.     Results: Results for orders placed or performed in visit on 07/06/20 (from the past 24 hour(s))  POCT Wet Prep with KOH     Status: Abnormal   Collection Time: 07/06/20  3:51 PM  Result Value Ref Range   Trichomonas, UA Negative    Clue Cells Wet Prep HPF POC neg    Epithelial Wet Prep HPF POC     Yeast Wet Prep HPF POC few    Bacteria Wet Prep HPF POC     RBC Wet Prep HPF POC     WBC Wet Prep HPF POC     KOH Prep POC Negative Negative     Assessment/Plan: Candidal vaginitis - Plan: fluconazole (DIFLUCAN) 150 MG tablet, POCT Wet Prep with KOH,  Cervicovaginal ancillary only; pos sx and wet prep. Rx diflucan/OTC hydrocortisone crm ext prn. F/u prn.   Vaginal odor - Plan: Cervicovaginal ancillary only--Rule out BV with culture. Will  f/u if pos.    Meds ordered this encounter  Medications  . fluconazole (DIFLUCAN) 150 MG tablet    Sig: Take 1 tablet (150 mg total) by mouth once for 1 dose.    Dispense:  1 tablet    Refill:  0    Order Specific Question:   Supervising Provider    Answer:   Nadara Mustard [[id number]]      Return if symptoms worsen or fail to improve.  Nattaly Yebra B. Abbie Berling, PA-C 07/06/2020 3:53 PM

## 2020-07-06 ENCOUNTER — Other Ambulatory Visit: Payer: Self-pay

## 2020-07-06 ENCOUNTER — Other Ambulatory Visit (HOSPITAL_COMMUNITY)
Admission: RE | Admit: 2020-07-06 | Discharge: 2020-07-06 | Disposition: A | Discharge status: Home / Self Care | Payer: BC Managed Care – PPO | Source: Ambulatory Visit | Attending: Obstetrics and Gynecology | Admitting: Obstetrics and Gynecology

## 2020-07-06 ENCOUNTER — Ambulatory Visit (INDEPENDENT_AMBULATORY_CARE_PROVIDER_SITE_OTHER): Payer: BC Managed Care – PPO | Admitting: Obstetrics and Gynecology

## 2020-07-06 ENCOUNTER — Encounter: Payer: Self-pay | Admitting: Obstetrics and Gynecology

## 2020-07-06 VITALS — BP 136/80 | Ht 63.0 in | Wt 250.0 lb

## 2020-07-06 DIAGNOSIS — N898 Other specified noninflammatory disorders of vagina: Secondary | ICD-10-CM

## 2020-07-06 DIAGNOSIS — B373 Candidiasis of vulva and vagina: Secondary | ICD-10-CM | POA: Insufficient documentation

## 2020-07-06 DIAGNOSIS — B9689 Other specified bacterial agents as the cause of diseases classified elsewhere: Secondary | ICD-10-CM | POA: Insufficient documentation

## 2020-07-06 DIAGNOSIS — N760 Acute vaginitis: Secondary | ICD-10-CM | POA: Diagnosis not present

## 2020-07-06 DIAGNOSIS — Z113 Encounter for screening for infections with a predominantly sexual mode of transmission: Secondary | ICD-10-CM | POA: Insufficient documentation

## 2020-07-06 DIAGNOSIS — B3731 Acute candidiasis of vulva and vagina: Secondary | ICD-10-CM

## 2020-07-06 LAB — POCT WET PREP WITH KOH
Clue Cells Wet Prep HPF POC: NEGATIVE
KOH Prep POC: NEGATIVE
Trichomonas, UA: NEGATIVE

## 2020-07-06 MED ORDER — FLUCONAZOLE 150 MG PO TABS: 150.0000 mg | ORAL_TABLET | Freq: Once | ORAL | 0 refills | Status: AC

## 2020-07-06 NOTE — Patient Instructions (Signed)
I value your feedback and you entrusting us with your care. If you get a  patient survey, I would appreciate you taking the time to let us know about your experience today. Thank you! ? ? ?

## 2020-07-08 LAB — CERVICOVAGINAL ANCILLARY ONLY
Bacterial Vaginitis (gardnerella): POSITIVE — AB
Candida Glabrata: NEGATIVE
Candida Vaginitis: NEGATIVE
Comment: NEGATIVE
Comment: NEGATIVE
Comment: NEGATIVE

## 2020-07-08 MED ORDER — METRONIDAZOLE 500 MG PO TABS: 500.0000 mg | ORAL_TABLET | Freq: Two times a day (BID) | ORAL | 0 refills | Status: AC

## 2020-07-08 NOTE — Addendum Note (Signed)
Addended by: Althea Grimmer B on: 07/08/2020 01:45 PM   Modules accepted: Orders

## 2020-07-21 DIAGNOSIS — M79642 Pain in left hand: Secondary | ICD-10-CM | POA: Diagnosis not present

## 2020-07-21 DIAGNOSIS — M79641 Pain in right hand: Secondary | ICD-10-CM | POA: Diagnosis not present

## 2020-07-23 DIAGNOSIS — J309 Allergic rhinitis, unspecified: Secondary | ICD-10-CM | POA: Diagnosis not present

## 2020-07-28 ENCOUNTER — Other Ambulatory Visit: Payer: Self-pay | Admitting: Obstetrics and Gynecology

## 2020-07-28 ENCOUNTER — Encounter: Payer: Self-pay | Admitting: Obstetrics and Gynecology

## 2020-07-28 MED ORDER — CLINDAMYCIN HCL 300 MG PO CAPS: 300.0000 mg | ORAL_CAPSULE | Freq: Two times a day (BID) | ORAL | 0 refills | Status: AC

## 2020-07-28 NOTE — Progress Notes (Signed)
Rx clindamycin for recurrent BV sx.

## 2020-08-14 NOTE — Telephone Encounter (Signed)
Called pt and she is okay waiting for ABC's reply since she is not in pain. Will go to urgent care if sx get worse.

## 2020-08-24 DIAGNOSIS — Z026 Encounter for examination for insurance purposes: Secondary | ICD-10-CM | POA: Diagnosis not present

## 2020-08-24 DIAGNOSIS — S61259A Open bite of unspecified finger without damage to nail, initial encounter: Secondary | ICD-10-CM | POA: Diagnosis not present

## 2020-08-24 DIAGNOSIS — S61211A Laceration without foreign body of left index finger without damage to nail, initial encounter: Secondary | ICD-10-CM | POA: Diagnosis not present

## 2020-08-24 DIAGNOSIS — W540XXA Bitten by dog, initial encounter: Secondary | ICD-10-CM | POA: Diagnosis not present

## 2020-10-01 DIAGNOSIS — R0602 Shortness of breath: Secondary | ICD-10-CM | POA: Diagnosis not present

## 2020-10-01 DIAGNOSIS — R053 Chronic cough: Secondary | ICD-10-CM | POA: Diagnosis not present

## 2020-10-01 DIAGNOSIS — R062 Wheezing: Secondary | ICD-10-CM | POA: Diagnosis not present

## 2020-10-01 DIAGNOSIS — J3489 Other specified disorders of nose and nasal sinuses: Secondary | ICD-10-CM | POA: Diagnosis not present

## 2020-10-01 DIAGNOSIS — J342 Deviated nasal septum: Secondary | ICD-10-CM | POA: Diagnosis not present

## 2020-10-01 DIAGNOSIS — R0609 Other forms of dyspnea: Secondary | ICD-10-CM | POA: Diagnosis not present

## 2020-12-30 DIAGNOSIS — Z23 Encounter for immunization: Secondary | ICD-10-CM | POA: Diagnosis not present

## 2021-01-15 DIAGNOSIS — M67431 Ganglion, right wrist: Secondary | ICD-10-CM | POA: Diagnosis not present

## 2021-01-15 DIAGNOSIS — G5603 Carpal tunnel syndrome, bilateral upper limbs: Secondary | ICD-10-CM | POA: Diagnosis not present

## 2021-04-11 DIAGNOSIS — N390 Urinary tract infection, site not specified: Secondary | ICD-10-CM | POA: Diagnosis not present

## 2021-05-08 ENCOUNTER — Other Ambulatory Visit: Payer: Self-pay | Admitting: Podiatry

## 2021-08-07 ENCOUNTER — Other Ambulatory Visit: Payer: Self-pay | Admitting: Podiatry

## 2021-08-09 ENCOUNTER — Other Ambulatory Visit: Payer: Self-pay | Admitting: Podiatry

## 2021-08-09 ENCOUNTER — Telehealth: Payer: Self-pay | Admitting: *Deleted

## 2021-08-09 MED ORDER — DICLOFENAC SODIUM 75 MG PO TBEC: 75.0000 mg | DELAYED_RELEASE_TABLET | Freq: Two times a day (BID) | ORAL | 3 refills | Status: AC

## 2021-08-09 MED ORDER — GABAPENTIN 100 MG PO CAPS: 100.0000 mg | ORAL_CAPSULE | Freq: Three times a day (TID) | ORAL | 3 refills | Status: AC

## 2021-08-09 NOTE — Telephone Encounter (Signed)
Prescriptions sent to the pharmacy.  Thanks, Dr. Logan Bores

## 2021-08-09 NOTE — Telephone Encounter (Signed)
"  I am calling in reference to two prescriptions that I need sent to [facility] in [place].  I will be leaving tomorrow morning to go to [place].  I've already spoke with the pharmacy.  They said they sent the rest.  I need a refill for the Diclofenac and Gabapentin.  If you have any questions, give me a call." ?

## 2022-04-18 IMAGING — MG DIGITAL SCREENING BILAT W/ TOMO W/ CAD
6 of 10 series · 6 of 30 positions shown · non-contrast
Comparison: Previous exam(s).

CLINICAL DATA: Screening.

EXAM:
DIGITAL SCREENING BILATERAL MAMMOGRAM WITH TOMO AND CAD

[R MLO synth-2D (1 of 2)]
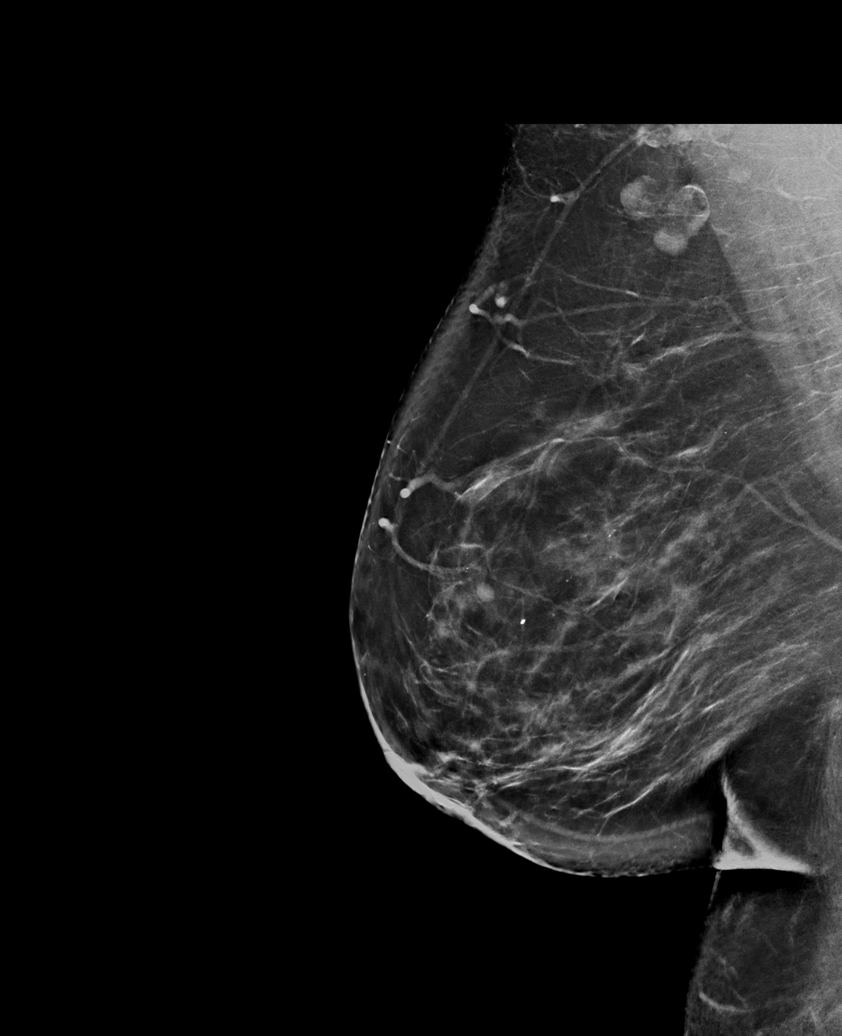

[L CC synth-2D]
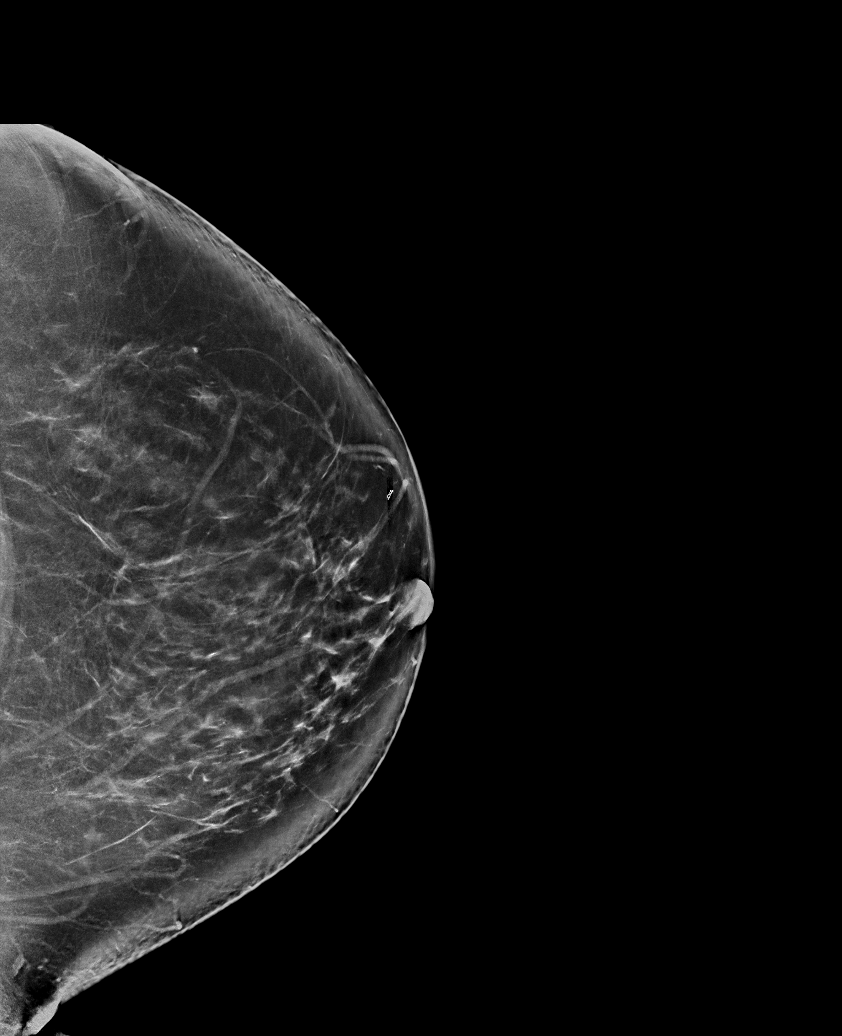

[L MLO synth-2D]
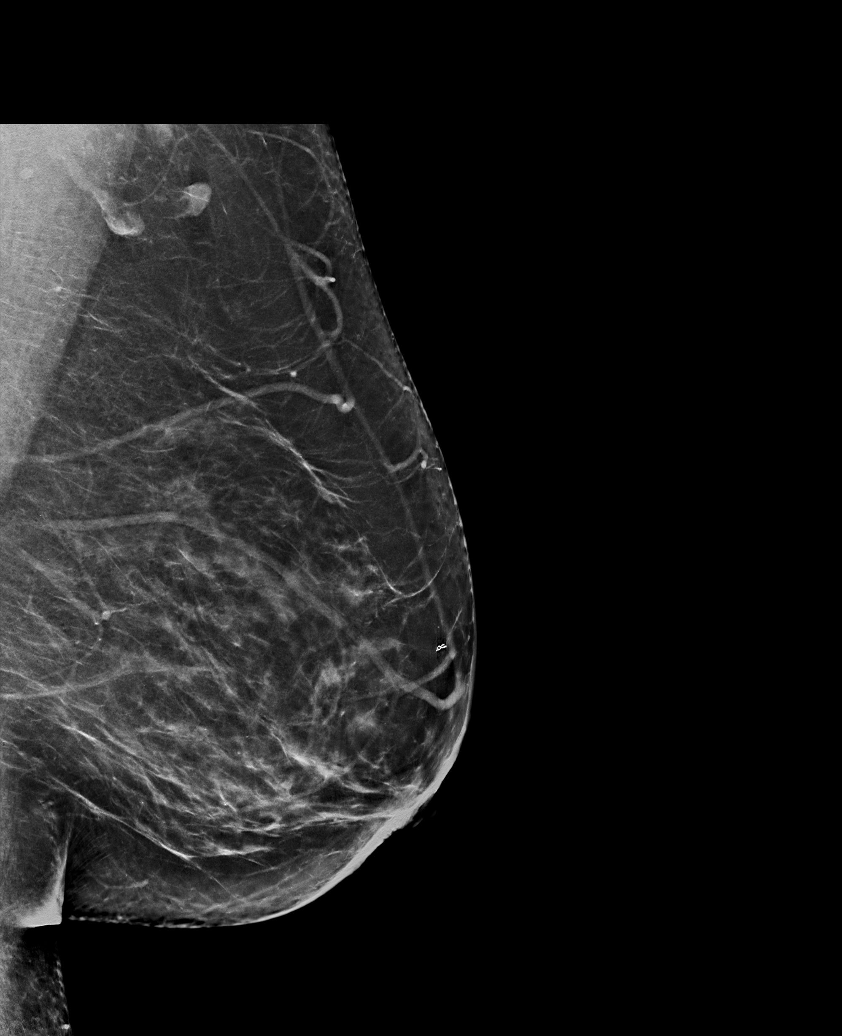

[R CC synth-2D]
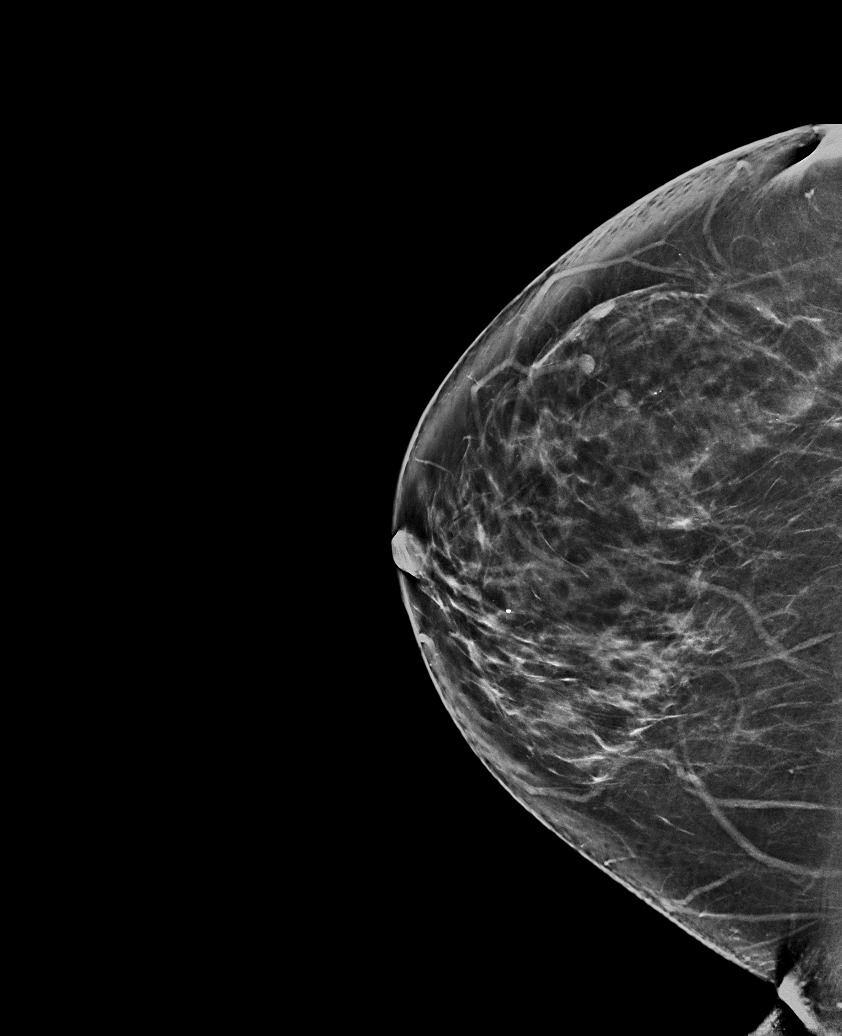

[R MLO synth-2D (2 of 2)]
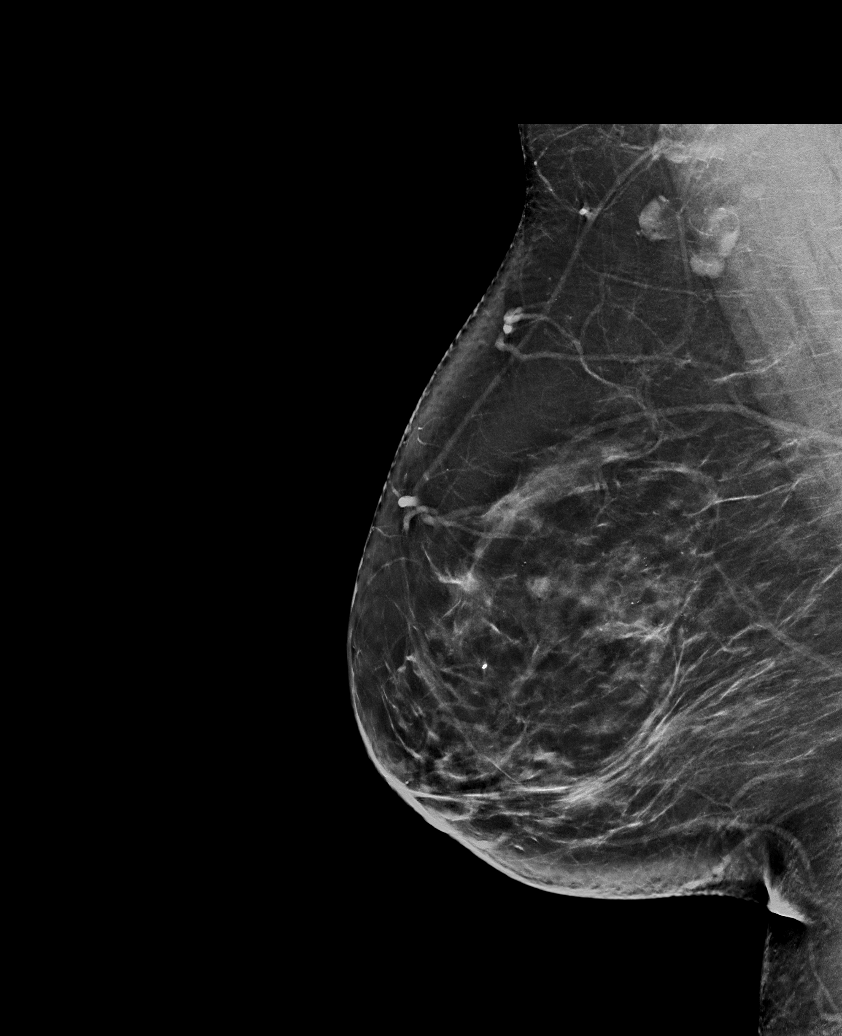

[R MLO tomo · tomo slice 43/84.0]
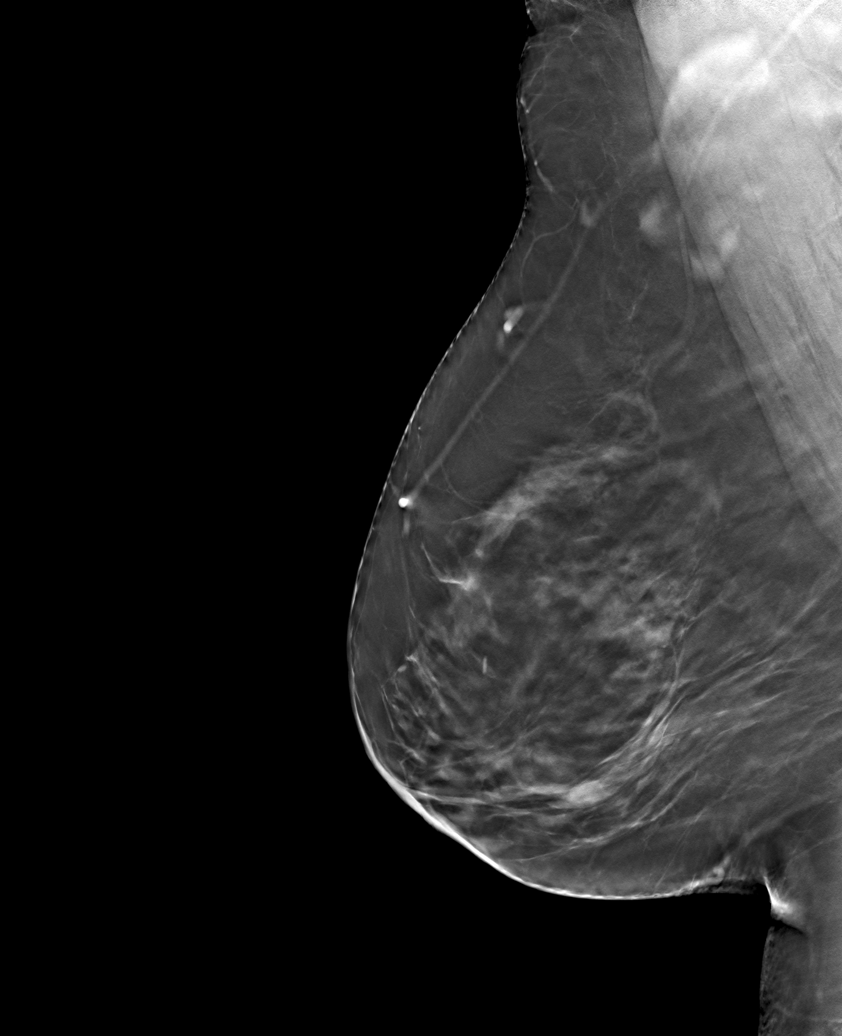

[6 of 30 positions shown; findings below may reference images not displayed]

ACR Breast Density Category b: There are scattered areas of
fibroglandular density.
FINDINGS: There are no findings suspicious for malignancy. Images were
processed with CAD.
IMPRESSION: No mammographic evidence of malignancy. A result letter of this
screening mammogram will be mailed directly to the patient.

RECOMMENDATION:
Screening mammogram in one year. (Code:[id number])

BI-RADS CATEGORY  1: Negative.

## 2022-08-30 ENCOUNTER — Encounter: Payer: Self-pay | Admitting: Obstetrics

## 2022-08-30 ENCOUNTER — Ambulatory Visit: Payer: Managed Care, Other (non HMO) | Admitting: Obstetrics

## 2022-08-30 VITALS — BP 149/67 | HR 49 | Wt 253.0 lb

## 2022-08-30 DIAGNOSIS — Z3046 Encounter for surveillance of implantable subdermal contraceptive: Secondary | ICD-10-CM | POA: Diagnosis not present

## 2022-08-30 NOTE — Progress Notes (Signed)
Robyn Espinoza presents for removal of Nexplanon, and reinsertion. She has had Nexplanon for many years and is happy with this method. She is aware that at age 47, she is unlikely to get pregnant should she stop contracepting; however, it helps with cycle control, and she had a Hx of endometriosis. Robyn Espinoza works I [place] and is involved in [employer] supplies there and in [place].she has not been able to get another Nexplanon there. GYNECOLOGY PROCEDURE NOTE  Implanon removal discussed in detail.  Risks of infection, bleeding, nerve injury all reviewed.  Patient understands risks and desires to proceed.  Verbal consent obtained.  Patient is certain she wants the implanon removed.  All questions answered.  Procedure: Patient placed in dorsal supine with left arm above head, elbow flexed at 90 degrees, arm resting on examination table.  Implanon identified without problems.  Betadine scrub x3.  1 ml of 1% lidocaine injected under implanon device without problems.  Sterile gloves applied.  Small 0.5cm incision made at distal tip of implanon device with 11 blade scalpel.  Implanon brought to incision and grasped with a small kelly clamp.  Implanon removed intact without problems.  Pressure applied to incision.  Hemostasis obtained.  Steri-strips applied, followed by bandage and compression dressing.  Patient tolerated procedure well.  No complications.    GYNECOLOGY PROCEDURE NOTE    She provided informed consent, signed copy in the chart, time out was performed. Pregnancy test was negative, with self reported LMP of No LMP recorded. Patient has had an implant.  She understands that Nexplanon is a progesterone only therapy, and that patients often patients have irregular and unpredictable vaginal bleeding or amenorrhea. She understands that other side effects are possible related to systemic progesterone, including but not limited to, headaches, breast tenderness, nausea, and irritability. While effective at  preventing pregnancy long acting reversible contraceptives do not prevent transmission of sexually transmitted diseases and use of barrier methods for this purpose was discussed. The placement procedure for Nexplanon was reviewed with the patient in detail including risks of nerve injury, infection, bleeding and injury to other muscles or tendons. She understands that the Nexplanon implant is good for 3 years and needs to be removed at the end of that time.  She understands that Nexplanon is an extremely effective option for contraception, with failure rate of <1%. This information is reviewed today and all questions were answered. Informed consent was obtained, both verbally and written.   The patient is healthy and has no contraindications to Implanon use. Urine pregnancy test was performed today and was negative.  Procedure Appropriate time out taken.  Patient placed in dorsal supine with her left arm above head, elbow flexed at 90 degrees, arm resting on examination table.  The bicipital grove was palpated and site 8-10cm proximal to the medial epicondyle was indentified . The insertion site was prepped with a two betadine swabs and then injected with 2 cc of 1% lidocaine without epinephrine.  Nexplanon removed from sterile blister packaging,  Device confirmed in needle, before inserting full length of needle, tenting up the skin as the needle was advance.  The drug eluting rod was then deployed by pulling back the slider per the manufactures recommendation.  The implant was palpable by the clinician as well as the patient.  The insertion site covered dressed with a band aid before applying  a kerlex bandage pressure dressing..Minimal blood loss was noted during the procedure.  The patientt tolerated the procedure well.   She was instructed  to wear the bandage for 24 hours, call with any signs of infection.  She was given the Implanon card and instructed to have the rod removed in 3 years.  Charge [phone]  for nexplanon device, CPT [id number] for procedure J2001 for lidocaine administration Modifer 25, plus Modifer 79 is done during a global billing visit    Plan: J2001 for lidocaine block, 11982 for nexplanon removal
